# Patient Record
Sex: Female | Born: 1970
Health system: Southern US, Community
[De-identification: ages and names within clinical notes are randomized; demographics above are authoritative.]

## PROBLEM LIST (undated history)

## (undated) DIAGNOSIS — G4730 Sleep apnea, unspecified: Secondary | ICD-10-CM

## (undated) DIAGNOSIS — I10 Essential (primary) hypertension: Secondary | ICD-10-CM

## (undated) DIAGNOSIS — R0602 Shortness of breath: Secondary | ICD-10-CM

## (undated) DIAGNOSIS — IMO0002 Reserved for concepts with insufficient information to code with codable children: Secondary | ICD-10-CM

## (undated) DIAGNOSIS — M329 Systemic lupus erythematosus, unspecified: Secondary | ICD-10-CM

## (undated) HISTORY — DX: Reserved for concepts with insufficient information to code with codable children: IMO0002

## (undated) HISTORY — DX: Systemic lupus erythematosus, unspecified: M32.9

## (undated) HISTORY — DX: Essential (primary) hypertension: I10

---

## 1999-10-26 ENCOUNTER — Other Ambulatory Visit: Admission: RE | Admit: 1999-10-26 | Discharge: 1999-10-26 | Payer: Self-pay | Admitting: Obstetrics and Gynecology

## 2001-03-20 ENCOUNTER — Other Ambulatory Visit: Admission: RE | Admit: 2001-03-20 | Discharge: 2001-03-20 | Payer: Self-pay | Admitting: Gynecology

## 2001-09-24 ENCOUNTER — Other Ambulatory Visit: Admission: RE | Admit: 2001-09-24 | Discharge: 2001-09-24 | Payer: Self-pay | Admitting: Family Medicine

## 2002-03-22 ENCOUNTER — Other Ambulatory Visit: Admission: RE | Admit: 2002-03-22 | Discharge: 2002-03-22 | Payer: Self-pay | Admitting: Gynecology

## 2002-04-09 ENCOUNTER — Encounter: Payer: Self-pay | Admitting: Gynecology

## 2002-04-09 ENCOUNTER — Ambulatory Visit (HOSPITAL_COMMUNITY): Admission: RE | Admit: 2002-04-09 | Discharge: 2002-04-09 | Payer: Self-pay | Admitting: Gynecology

## 2002-05-23 ENCOUNTER — Ambulatory Visit (HOSPITAL_COMMUNITY): Admission: RE | Admit: 2002-05-23 | Discharge: 2002-05-23 | Payer: Self-pay | Admitting: Internal Medicine

## 2002-07-18 HISTORY — PX: ABDOMINAL SURGERY: SHX537

## 2002-07-29 ENCOUNTER — Encounter (INDEPENDENT_AMBULATORY_CARE_PROVIDER_SITE_OTHER): Payer: Self-pay | Admitting: Specialist

## 2002-07-29 ENCOUNTER — Inpatient Hospital Stay (HOSPITAL_COMMUNITY): Admission: RE | Admit: 2002-07-29 | Discharge: 2002-08-01 | Payer: Self-pay | Admitting: Gynecology

## 2003-05-06 ENCOUNTER — Other Ambulatory Visit: Admission: RE | Admit: 2003-05-06 | Discharge: 2003-05-06 | Payer: Self-pay | Admitting: Gynecology

## 2003-05-07 ENCOUNTER — Encounter: Admission: RE | Admit: 2003-05-07 | Discharge: 2003-05-07 | Payer: Self-pay | Admitting: Gynecology

## 2006-05-08 ENCOUNTER — Other Ambulatory Visit: Admission: RE | Admit: 2006-05-08 | Discharge: 2006-05-08 | Payer: Self-pay | Admitting: Gynecology

## 2007-05-09 ENCOUNTER — Other Ambulatory Visit: Admission: RE | Admit: 2007-05-09 | Discharge: 2007-05-09 | Payer: Self-pay | Admitting: Gynecology

## 2007-05-14 ENCOUNTER — Ambulatory Visit: Payer: Self-pay | Admitting: Internal Medicine

## 2007-06-21 ENCOUNTER — Ambulatory Visit: Payer: Self-pay | Admitting: Internal Medicine

## 2007-06-21 ENCOUNTER — Encounter: Payer: Self-pay | Admitting: Internal Medicine

## 2007-06-25 ENCOUNTER — Encounter: Payer: Self-pay | Admitting: Internal Medicine

## 2007-07-12 DIAGNOSIS — D259 Leiomyoma of uterus, unspecified: Secondary | ICD-10-CM | POA: Insufficient documentation

## 2007-07-12 DIAGNOSIS — K5909 Other constipation: Secondary | ICD-10-CM | POA: Insufficient documentation

## 2007-07-13 ENCOUNTER — Ambulatory Visit: Payer: Self-pay | Admitting: Internal Medicine

## 2007-07-13 DIAGNOSIS — K5120 Ulcerative (chronic) proctitis without complications: Secondary | ICD-10-CM | POA: Insufficient documentation

## 2007-07-13 DIAGNOSIS — R110 Nausea: Secondary | ICD-10-CM | POA: Insufficient documentation

## 2007-09-20 ENCOUNTER — Ambulatory Visit: Payer: Self-pay | Admitting: Internal Medicine

## 2008-01-25 ENCOUNTER — Ambulatory Visit (HOSPITAL_COMMUNITY): Admission: RE | Admit: 2008-01-25 | Discharge: 2008-01-25 | Payer: Self-pay | Admitting: Obstetrics and Gynecology

## 2008-02-05 ENCOUNTER — Encounter: Admission: RE | Admit: 2008-02-05 | Discharge: 2008-02-05 | Payer: Self-pay | Admitting: Family Medicine

## 2008-03-04 ENCOUNTER — Encounter (INDEPENDENT_AMBULATORY_CARE_PROVIDER_SITE_OTHER): Payer: Self-pay | Admitting: *Deleted

## 2008-05-15 ENCOUNTER — Other Ambulatory Visit: Admission: RE | Admit: 2008-05-15 | Discharge: 2008-05-15 | Payer: Self-pay | Admitting: Gynecology

## 2008-05-15 ENCOUNTER — Ambulatory Visit: Payer: Self-pay | Admitting: Gynecology

## 2008-05-15 ENCOUNTER — Encounter: Payer: Self-pay | Admitting: Gynecology

## 2008-05-28 ENCOUNTER — Ambulatory Visit: Payer: Self-pay | Admitting: Gynecology

## 2008-06-20 ENCOUNTER — Ambulatory Visit: Payer: Self-pay | Admitting: Gynecology

## 2008-07-03 ENCOUNTER — Ambulatory Visit: Payer: Self-pay | Admitting: Gynecology

## 2008-07-04 ENCOUNTER — Ambulatory Visit: Payer: Self-pay | Admitting: Gynecology

## 2008-11-06 ENCOUNTER — Encounter (INDEPENDENT_AMBULATORY_CARE_PROVIDER_SITE_OTHER): Payer: Self-pay | Admitting: Family Medicine

## 2008-11-06 ENCOUNTER — Ambulatory Visit (HOSPITAL_COMMUNITY): Admission: RE | Admit: 2008-11-06 | Discharge: 2008-11-06 | Payer: Self-pay | Admitting: Family Medicine

## 2008-11-06 ENCOUNTER — Ambulatory Visit: Payer: Self-pay | Admitting: Vascular Surgery

## 2008-11-13 ENCOUNTER — Ambulatory Visit: Payer: Self-pay | Admitting: Gynecology

## 2008-12-02 ENCOUNTER — Ambulatory Visit: Payer: Self-pay | Admitting: Gynecology

## 2008-12-15 ENCOUNTER — Encounter (INDEPENDENT_AMBULATORY_CARE_PROVIDER_SITE_OTHER): Payer: Self-pay | Admitting: *Deleted

## 2008-12-15 DIAGNOSIS — L52 Erythema nodosum: Secondary | ICD-10-CM | POA: Insufficient documentation

## 2008-12-15 DIAGNOSIS — M2550 Pain in unspecified joint: Secondary | ICD-10-CM | POA: Insufficient documentation

## 2008-12-29 ENCOUNTER — Ambulatory Visit: Payer: Self-pay | Admitting: Gynecology

## 2008-12-30 ENCOUNTER — Ambulatory Visit: Payer: Self-pay | Admitting: Infectious Diseases

## 2009-01-20 ENCOUNTER — Ambulatory Visit: Payer: Self-pay | Admitting: Gynecology

## 2009-01-27 ENCOUNTER — Telehealth: Payer: Self-pay | Admitting: Infectious Diseases

## 2009-05-06 ENCOUNTER — Telehealth: Payer: Self-pay | Admitting: Internal Medicine

## 2009-06-01 ENCOUNTER — Ambulatory Visit: Payer: Self-pay | Admitting: Gynecology

## 2009-06-01 ENCOUNTER — Other Ambulatory Visit: Admission: RE | Admit: 2009-06-01 | Discharge: 2009-06-01 | Payer: Self-pay | Admitting: Gynecology

## 2009-07-10 ENCOUNTER — Ambulatory Visit: Payer: Self-pay | Admitting: Gynecology

## 2010-02-16 NOTE — Progress Notes (Signed)
Summary: need to confirm prior positive ppd  Phone Note Other Incoming   Summary of Call: received note from HD stating she does not meet criteria for LTBI treatment due to ppd of only 2 mm. She has a reported history of positive ppds in past and a history of TB contact as a child.  SHe is currently on prednisone which can make the ppd negative.  WOuld consider quantiferon gold test but that can also be neg when on prednisone.  Can you call Dr Paulino Rily office (her PCP) and see if they ahve a documented positie PPD on her going back beforeshe was on prednisone.  Also Dr Nickola Major office may have one.  If not we need to discuss with her when her PPD was positive and try to find documentaiont of it so we can get the HD to treat her. Thanks Theodoro Grist    Initial call taken by: Clydie Braun MD,  January 27, 2009 11:02 AM  Follow-up for Phone Call        [facility] They do not have any TB skin test on file.Pt  has switched to  [facility] [place], please call this office [phone] the needed info. Tomasita Morrow RN  February 11, 2009 3:07 PM  RN spoke w/ Dr. Nickola Major clinic staff.  Requested PPD results and medications that the pt. was on at the time that the PPDs were placed.  [facility] [place] to fax these records Jennet Maduro RN  February 13, 2009 9:57 AM  Follow-up by: Tomasita Morrow RN,  February 11, 2009 3:07 PM

## 2010-02-16 NOTE — Progress Notes (Signed)
Summary: Schedule Office visit.   Phone Note Outgoing Call Call back at [facility] Phone [phone]   Call placed by: Harlow Mares CMA Duncan Dull),  May 06, 2009 3:06 PM Call placed to: Patient Summary of Call: Left message on patients machine to call back. patient is due for a follow up office visit.  Initial call taken by: Harlow Mares CMA Duncan Dull),  May 06, 2009 3:06 PM  Follow-up for Phone Call        Left message on patients machine to call back.  Follow-up by: Harlow Mares CMA Duncan Dull),  May 18, 2009 10:43 AM

## 2010-06-04 NOTE — H&P (Signed)
NAMEMIKAYA, BUNNER                           ACCOUNT NO.:  [id number]   MEDICAL RECORD NO.:  [id number]                   PATIENT TYPE:  INP   LOCATION:  NA                                   [facility]   PHYSICIAN:  Timothy P. Fontaine, M.D.           DATE OF BIRTH:  Dec 24, 1970   DATE OF ADMISSION:  07/29/2002  DATE OF DISCHARGE:                                HISTORY & PHYSICAL   CHIEF COMPLAINT:  Enlarging leiomyomata.   HISTORY OF PRESENT ILLNESS:  The patient is a 40 year old G0 with a history  of leiomyomata.  Over the past year her leiomyomata have continued to  increase in size and now are the size of an 18-week pregnancy and she is  admitted at this time for myomectomy.   PAST MEDICAL HISTORY:  None.   PAST SURGICAL HISTORY:  None.   CURRENT MEDICATIONS:  Oral contraceptives, vitamins, and iron.   FAMILY HISTORY:  Noncontributory.   SOCIAL HISTORY:  Noncontributory.   REVIEW OF SYSTEMS:  Noncontributory.   ADMISSION PHYSICAL EXAMINATION:  VITALS:  Afebrile; vital signs are stable.  HEENT:  Normal.  LUNGS:  Clear.  CARDIAC:  Regular rate; no rubs, murmurs, or gallops.  ABDOMEN:  Palpable uterus approximately 18 weeks' size; no other masses,  guarding, rebound, or organomegaly.  BIMANUAL PELVIC:  Uterus again 18 weeks' size, bulky, fills the pelvis.   ASSESSMENT AND PLAN:  The patient is a 40 year old G0 enlarging uterus -  ultrasound consistent with leiomyomata.  Ovaries are difficult to visualize  due to the bulk of the uterus.  The patient did have an HSG which showed a  normal-appearing cavity with bilateral fill and spill .  The patient is  interested in maintaining childbirth potential although she is not  immediately seeking pregnancy.  The options for continued expectant  management versus myomectomy now were discussed and the issues and given the  continued growth throughout the past year it was decided to proceed with  surgery now at 18 weeks'  size rather than awaiting it to enlarge further.  I  have discussed the issues of myomectomy with her to include the potential  negative impact as far as fertility in the future is concerned.  The patient  clearly understands the possibility of hysterectomy during the myomectomy if  significant bleeding or other complications arises and she gives me  permission to proceed with hysterectomy if I feel this is necessary.  She  also understands that if certain fibroids are in positions that I am afraid  that if we proceed with attempted removal that we realistically could  jeopardize hysterectomy or fertility that we will leave this leiomyomata and  that we may or may not need to address these in the future.  The patient  also understands that there is scarring due to the myomectomy both tubal as  well as intrauterine and that this may lead to  infertility following the  procedure.  She understands there are no guarantees I am going to remove all  the fibroids, that smaller ones that we are unable to palpate or difficult  to palpate that these may remain and regrow in the future and that she  certainly is at significant risk of forming new leiomyomata in the future  and that she may again develop regrowth of leiomyomata in the future.  The  patient has given 2 units of autologous blood, she understands that bleeding  is a significant risk of this procedure and that we may need to exceed  autologous blood and use heterologous blood and the risks of transfusion  both autologous and heterologous were discussed and include transfusion  reaction, hepatitis, HIV, mad cow disease and other unknown entities.  The  risk of infection both internal requiring prolonged antibiotics and abscess  formation requiring reoperation as well as wound complications requiring  opening and draining of incisions and closure by secondary intention was all  discussed, understood, and accepted.  The risk of injury to  internal organs  including bowel, bladder, ureters, vessels and nerves necessitating major  exploratory reparative surgery and future reparative surgeries including  ostomy formation was all discussed, understood, and accepted.  The patient's  questions were answered to her satisfaction and she is ready to proceed with  surgery.  She will continue on her iron twice a day as well as her oral  contraceptives through surgery.                                               Timothy P. Audie Box, M.D.    TPF/MEDQ  D:  07/28/2002  T:  07/28/2002  Job:  [id number]

## 2010-06-04 NOTE — Assessment & Plan Note (Signed)
[facility]                         GASTROENTEROLOGY OFFICE NOTE   KALLE, BERNATH                        MRN:          [id number]  DATE:05/14/2007                            DOB:          1970-08-24    Patient is self-referred.   REASON FOR CONSULTATION:  Rectal bleeding, nausea.   HISTORY:  This is a 40 year old female with a history of hypertension,  who presents today regarding rectal bleeding and nausea.  Patient  reports intermittent rectal bleeding over the past two months.  She  notices blood mixed with stool as well as on the tissue.  Her problem  has worsened somewhat in time.  There has been no associated abdominal  pain or change in bowel habits.  She is chronically constipated.  There  is no rectal pain with bleeding.  Her weight has fluctuated.  She does  have some heart burn and indigestion.  She has noted some nausea in the  morning over the past two weeks.  Last menstrual period was three weeks  ago.  Patient did undergo colonoscopy in May, 2004 because of a  questionable history of colitis and complaints of rectal bleeding.  The  examination at that time was normal.   PAST MEDICAL HISTORY:  Hypertension.   PAST SURGICAL HISTORY:  Myomectomy for fibroids.   ALLERGIES:  No known drug allergies.   CURRENT MEDICATIONS:  1. Micardis 20/6.25 mg daily.  2. Multivitamin.   FAMILY HISTORY:  No family history of gastrointestinal malignancy.   SOCIAL HISTORY:  Patient is single without children.  She lives alone.  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  She does not  smoke.  Occasionally uses alcohol.   REVIEW OF SYSTEMS:  Per diagnostic evaluation form.   PHYSICAL EXAMINATION:  A well-appearing female in no acute distress.  Blood pressure is 142/92, heart rate 72 and regular, respirations 14 and  unlabored.  Weight is 255.4 pounds.  She is 5 feet 6 inches in height.  HEENT:  Sclerae are anicteric.  Conjunctivae  are pink.  Oral mucosa is  intact.  There is no adenopathy.  Thyroid is normal.  LUNGS:  Clear.  HEART:  Regular without murmur.  ABDOMEN:  Obese and soft without tenderness, mass, or obvious hernia.  Good bowel sounds are heard.  RECTAL:  Deferred.  (Negative per gynecologist and family practitioner).  EXTREMITIES:  Without edema.   IMPRESSION:  23. A 40 year old female with a two month history of intermittent      rectal bleeding of uncertain cause.  Negative recent rectal      examinations.  Rule out neoplasia.  Rule out internal hemorrhoids.  2. Nonspecific nausea:  This is on the background of mild heartburn      and indigestion.  Possibly reflux-related.  3. Chronic constipation:  Stable.   RECOMMENDATIONS:  1. Empiric trial of Protonix 40 mg daily to see if this helps with      indigestion and nausea.  2. MiraLax as needed for chronic constipation.  3. Schedule colonoscopy to evaluate rectal bleeding.  The nature of  the procedure as well as the risks, benefits and alternatives have      been reviewed.  She understood and agreed to proceed.  4. Ongoing general medical care with Dr. Paulino Rily.     Wilhemina Bonito. Marina Goodell, MD  Electronically Signed    JNP/MedQ  DD: 05/14/2007  DT: 05/14/2007  Job #: [id number]   cc:   Emeterio Reeve, MD

## 2010-06-04 NOTE — Discharge Summary (Signed)
   Kristine Hayes, Kristine Hayes                           ACCOUNT NO.:  [id number]   MEDICAL RECORD NO.:  [id number]                   PATIENT TYPE:  INP   LOCATION:  9137                                 [facility]   PHYSICIAN:  Timothy P. Fontaine, M.D.           DATE OF BIRTH:  12-Feb-1970   DATE OF ADMISSION:  07/29/2002  DATE OF DISCHARGE:  08/01/2002                                 DISCHARGE SUMMARY   DISCHARGE DIAGNOSIS:  Symptomatic fibroid uterus increasing in size.   PROCEDURE:  Myomectomy.   HISTORY OF PRESENT ILLNESS:  A 40 year old gravida 0 with enlarging uterus.  Ultrasound consistent with leiomyomata.  Ovaries were difficult to visualize  with ultrasound due to bulk of the uterus.  She did have a normal HSG prior  to surgery.  The patient is interested in maintaining childbirth potential  although is not immediately seeking pregnancy.  Her uterus is now at about  an 18-week size that has enlarged over the past year.  The patient was given  2 units of autologous blood during her surgery.   HOSPITAL COURSE:  The patient was admitted on July 29, 2002 for a  myomectomy.  Myomectomy was performed by Colin Broach, assisted by Dr.  Eda Paschal.  Fibroids were removed.  Postpartum she was voiding without  problems, did well, although did have a fever of unknown origin.  Tmax was  101.5.  She was discharged home on postoperative day #3, voiding, and doing  well, although she did have a fever.  Lungs were clear.  No UTI signs or  symptoms.  Postoperative CBC:  White count 14, hemoglobin 8.5, hematocrit  25.5, and platelets were 202.   DISPOSITION:  1. The patient was discharged home and was to follow up in the office.  2. Prescription for Keflex 500 q.i.d. - she has had it for three days and     will continue a seven-day course.  3. Prescription for Tylox one to two q.4-6h. p.r.n. was given.  4. Will return to the office in two weeks for a postoperative check, as well     as  call if fever persists.  5. Staples were removed, Steri-Strips applied.     Davonna Belling. Young, N.P.                      Timothy P. Audie Box, M.D.    [name]  D:  08/21/2002  T:  08/21/2002  Job:  [id number]

## 2010-06-04 NOTE — Op Note (Signed)
Kristine Hayes, Kristine Hayes                           ACCOUNT NO.:  [id number]   MEDICAL RECORD NO.:  [id number]                   PATIENT TYPE:  INP   LOCATION:  9137                                 [facility]   PHYSICIAN:  Timothy P. Fontaine, M.D.           DATE OF BIRTH:  12/16/1970   DATE OF PROCEDURE:  07/29/2002  DATE OF DISCHARGE:                                 OPERATIVE REPORT   PREOPERATIVE DIAGNOSIS:  Leiomyomata.   POSTOPERATIVE DIAGNOSIS:  Leiomyomata.   PROCEDURE:  Multiple myomectomy.   SURGEON:  Timothy P. Fontaine, M.D.   ASSISTANT:  Rande Brunt. Eda Paschal, M.D.   ANESTHESIA:  General endotracheal.   ESTIMATED BLOOD LOSS:  500 mL.   COMPLICATIONS:  None.   SPECIMENS:  21 myomas, approximately 670 g intrauterine measurement.   FINDINGS:  Anterior cul-de-sac normal.  Posterior cul-de-sac normal.  Uterus  grossly enlarged above the level of the umbilicus with multiple subserosal  intramural myomas ranging in size from 6-7 cm to  5 mm.  No palpable myomas  left at the end of the procedure.  Right and left fallopian tubes normal  length, caliber, fimbriated ends.  Right and left ovaries grossly normal,  free, and mobile.   PROCEDURE:  The patient is taken to the operating room, properly identified,  placed in supine position, and underwent general endotracheal anesthesia  without difficulty.  The patient received some abdominal preparation with  Betadine scrub and solution, a perineal and vaginal preparation with  Betadine solutions, the Foley catheter was placed in sterile technique, and  the patient was draped in the usual fashion.  The abdomen was sharply  entered through a Pfannenstiel incision, ensuring adequate hemostasis at all  levels.  The uterus was delivered through the incision and through multiple  incisions to include the posterior, fundal, and anterior uterine surfaces,  all visual and palpated myomas were excised.  All incision sites were  pretreated using Vasopressin in a 10 unit per 100 mL dilution.  Care was  taken to deliver as many myomas as possible through each incision but given  the multiple nature and multiple locations, several incisions were necessary  over the uterine surface.  The endometrial cavity was not entered during the  procedure and there was no evidence of tubal disruption at the level of the  intramural portion, as all incisions and dissections were carried out away  from these areas; but given the depth and multiple incisions, a subsequent  cesarean section will be recommended to the patient for delivery.  All myoma  sites were closed, initially using 0 Vicryl suture in figure-of-eight  interrupted suture to reapproximate the myometrium and subsequently 2-0 PDS  in a running burying-type suture for the serosal surface.  The uterus was  observed afterwards to assure adequate hemostasis and subsequently the  uterus was returned to the abdomen.  Subsequent irrigation of the  subcutaneous abdominal area was  given with copious amounts of normal saline,  and adequate hemostasis was visualized subsequently.  The anterior fascia  was reapproximated using 0 Vicryl suture in a running stitch starting at the  angle  and meeting in the middle.  Subsequent subcutaneous tissue irrigation then  demonstrated adequate hemostasis and the skin was reapproximated with  staples, sterile dressing applied.  The patient awakened without difficulty  and taken to the recovery room in good condition, having tolerated the  procedure well.                                                Timothy P. Audie Box, M.D.    TPF/MEDQ  D:  07/29/2002  T:  07/30/2002  Job:  [id number]

## 2010-06-10 ENCOUNTER — Encounter (INDEPENDENT_AMBULATORY_CARE_PROVIDER_SITE_OTHER): Payer: 59 | Admitting: Gynecology

## 2010-06-10 ENCOUNTER — Other Ambulatory Visit (HOSPITAL_COMMUNITY)
Admission: RE | Admit: 2010-06-10 | Discharge: 2010-06-10 | Disposition: A | Discharge status: Home / Self Care | Payer: 59 | Source: Ambulatory Visit | Attending: Gynecology | Admitting: Gynecology

## 2010-06-10 ENCOUNTER — Other Ambulatory Visit: Payer: Self-pay | Admitting: Gynecology

## 2010-06-10 DIAGNOSIS — Z833 Family history of diabetes mellitus: Secondary | ICD-10-CM

## 2010-06-10 DIAGNOSIS — Z124 Encounter for screening for malignant neoplasm of cervix: Secondary | ICD-10-CM | POA: Insufficient documentation

## 2010-06-10 DIAGNOSIS — Z01419 Encounter for gynecological examination (general) (routine) without abnormal findings: Secondary | ICD-10-CM

## 2010-06-10 DIAGNOSIS — Z13220 Encounter for screening for lipoid disorders: Secondary | ICD-10-CM

## 2010-06-11 ENCOUNTER — Encounter: Payer: Self-pay | Admitting: Gynecology

## 2010-06-22 ENCOUNTER — Other Ambulatory Visit: Payer: 59

## 2010-06-22 ENCOUNTER — Ambulatory Visit (INDEPENDENT_AMBULATORY_CARE_PROVIDER_SITE_OTHER): Payer: 59 | Admitting: Gynecology

## 2010-06-22 DIAGNOSIS — D259 Leiomyoma of uterus, unspecified: Secondary | ICD-10-CM

## 2010-06-22 DIAGNOSIS — N852 Hypertrophy of uterus: Secondary | ICD-10-CM

## 2010-06-22 DIAGNOSIS — D252 Subserosal leiomyoma of uterus: Secondary | ICD-10-CM

## 2010-08-12 ENCOUNTER — Other Ambulatory Visit: Payer: Self-pay | Admitting: Rheumatology

## 2010-08-12 DIAGNOSIS — Z1231 Encounter for screening mammogram for malignant neoplasm of breast: Secondary | ICD-10-CM

## 2010-08-24 ENCOUNTER — Ambulatory Visit
Admission: RE | Admit: 2010-08-24 | Discharge: 2010-08-24 | Disposition: A | Discharge status: Home / Self Care | Payer: 59 | Source: Ambulatory Visit | Attending: Rheumatology | Admitting: Rheumatology

## 2010-08-24 DIAGNOSIS — Z1231 Encounter for screening mammogram for malignant neoplasm of breast: Secondary | ICD-10-CM

## 2011-09-09 ENCOUNTER — Other Ambulatory Visit: Payer: Self-pay | Admitting: Family Medicine

## 2011-09-09 DIAGNOSIS — Z1231 Encounter for screening mammogram for malignant neoplasm of breast: Secondary | ICD-10-CM

## 2011-09-27 ENCOUNTER — Ambulatory Visit
Admission: RE | Admit: 2011-09-27 | Discharge: 2011-09-27 | Disposition: A | Discharge status: Home / Self Care | Payer: 59 | Source: Ambulatory Visit | Attending: Family Medicine | Admitting: Family Medicine

## 2011-09-27 DIAGNOSIS — Z1231 Encounter for screening mammogram for malignant neoplasm of breast: Secondary | ICD-10-CM

## 2011-10-06 ENCOUNTER — Encounter (HOSPITAL_COMMUNITY)
Admission: RE | Admit: 2011-10-06 | Discharge: 2011-10-06 | Disposition: A | Discharge status: Home / Self Care | Payer: 59 | Source: Ambulatory Visit | Attending: Family Medicine | Admitting: Family Medicine

## 2011-10-06 DIAGNOSIS — D649 Anemia, unspecified: Secondary | ICD-10-CM | POA: Insufficient documentation

## 2011-10-06 LAB — PREPARE RBC (CROSSMATCH)

## 2011-10-06 MED ORDER — FUROSEMIDE 10 MG/ML IJ SOLN
20.0000 mg | Freq: Once | INTRAMUSCULAR | Status: DC
  Filled 2011-10-06 (×3): qty 2

## 2011-10-06 MED ORDER — SODIUM CHLORIDE 0.9 % IV SOLN: INTRAVENOUS | Status: DC

## 2011-10-06 MED ORDER — DIPHENHYDRAMINE HCL 50 MG/ML IJ SOLN
25.0000 mg | Freq: Once | INTRAMUSCULAR | Status: AC
  Administered 2011-10-06: 25 mg via INTRAVENOUS
  Filled 2011-10-06: qty 1

## 2011-10-07 LAB — TYPE AND SCREEN
ABO/RH(D): A POS
DAT, IgG: POSITIVE
Unit division: 0
Unit division: 0

## 2011-11-04 ENCOUNTER — Encounter: Payer: Self-pay | Admitting: Gynecology

## 2011-11-04 ENCOUNTER — Ambulatory Visit (INDEPENDENT_AMBULATORY_CARE_PROVIDER_SITE_OTHER): Payer: 59 | Admitting: Gynecology

## 2011-11-04 VITALS — BP 130/76 | Ht 65.5 in | Wt 268.0 lb

## 2011-11-04 DIAGNOSIS — N920 Excessive and frequent menstruation with regular cycle: Secondary | ICD-10-CM

## 2011-11-04 DIAGNOSIS — Z01419 Encounter for gynecological examination (general) (routine) without abnormal findings: Secondary | ICD-10-CM

## 2011-11-04 DIAGNOSIS — D259 Leiomyoma of uterus, unspecified: Secondary | ICD-10-CM

## 2011-11-04 LAB — CBC WITH DIFFERENTIAL/PLATELET
Basophils Absolute: 0.1 10*3/uL (ref 0.0–0.1)
Basophils Relative: 1 % (ref 0–1)
Hemoglobin: 9.1 g/dL — ABNORMAL LOW (ref 12.0–15.0)
Lymphocytes Relative: 33 % (ref 12–46)
MCHC: 29.4 g/dL — ABNORMAL LOW (ref 30.0–36.0)
Monocytes Relative: 12 % (ref 3–12)
Neutro Abs: 4.6 10*3/uL (ref 1.7–7.7)
Neutrophils Relative %: 50 % (ref 43–77)
WBC: 9.2 10*3/uL (ref 4.0–10.5)

## 2011-11-04 NOTE — Progress Notes (Signed)
Kristine Hayes [birth year] [id number]        41 y.o.  G2P0020 for annual exam.  Several issues noted below.  Past medical history,surgical history, medications, allergies, family history and social history were all reviewed and documented in the EPIC chart. ROS:  Was performed and pertinent positives and negatives are included in the history.  Exam: [name] assistant Filed Vitals:   11/04/11 1517  BP: 130/76  Height: 5' 5.5" (1.664 m)  Weight: 268 lb (121.564 kg)   General appearance  Normal Skin grossly normal Head/Neck normal with no cervical or supraclavicular adenopathy thyroid normal Lungs  clear Cardiac RR, without RMG Abdominal  soft, nontender, without organomegaly or hernia. Midline pelvic mass to umbilicus consistent with history of leiomyoma Breasts  examined lying and sitting without masses, retractions, discharge or axillary adenopathy. Pelvic  Ext/BUS/vagina  normal   Cervix  normal   Uterus  18 week size irregular consistent with leiomyoma  Adnexa unable to evaluate due to pelvic mass    Anus and perineum  normal   Rectovaginal  normal sphincter tone without palpated masses or tenderness.    Assessment/Plan:  41 y.o. G81P0020 female for annual exam.   1. Leiomyoma. Patient recently transfused by her primary physician due to anemia. Her menorrhagia continues. Myomas 18 weeks size. Reviewed options again to include conservative such as myomectomy/uterine artery embolization versus hysterectomy.. Patient wants to proceed with hysterectomy.  Reviewed ovarian conservation issue my recommendation to keep both ovaries as she is no strong family history of ovarian cancer and the benefits versus the risks reviewed.  I did recommend Depo-Lupron suppression at least for 3 months to allow for hemoglobin recovery period patient thinking of having the surgery in the spring and I would then suggest 6 month Lupron to avoid future transfusions. Patient agrees with this we'll make arrangements  to move towards this.  Side effect profile and risks of Depo-Lupron reviewed to include accelerated bone loss and symptoms such as hot flushes/sweats.  She will continue on daily iron supplementation. 2. Pap smear.  No Pap smear done today. Last Pap smear May 2012. No history of significant abnormal Pap smears. We'll plan less frequent screening every 3-5 years. 3. Mammography. Patient had in September.  Will continue with annual mammography. SBE monthly reviewed. 4. Health maintenance. Will check baseline CBC. No other blood work that is done through her primary physician's office. Follow up for Lupron and all ultimately for hysterectomy.    Dara Lords MD, 3:53 PM 11/04/2011

## 2011-11-04 NOTE — Patient Instructions (Addendum)
Office will contact you to arrange for Depo-Lupron shots and hysterectomy.  Uterine Fibroid A uterine fibroid is a growth (tumor) that occurs in a woman's uterus. This type of tumor is not cancerous and does not spread out of the uterus. A woman can have one or many fibroids, and the fiboid(s) can become quite large. A fibroid can vary in size, weight, and where it grows in the uterus. Most fibroids do not require medical treatment, but some can cause pain or heavy bleeding during and between periods. CAUSES  A fibroid is the result of a single uterine cell that keeps growing (unregulated), which is different than most cells in the human body. Most cells have a control mechanism that keeps them from reproducing without control.  SYMPTOMS   Bleeding.  Pelvic pain and pressure.  Bladder problems due to the size of the fibroid.  Infertility and miscarriages depending on the size and location of the fibroid. DIAGNOSIS  A diagnosis is made by physical exam. Your caregiver may feel the lumpy tumors during a pelvic exam. Important information regarding size, location, and number of tumors can be gained by having an ultrasound. It is rare that other tests, such as a CT scan or MRI, are needed. TREATMENT   Your caregiver may recommend watchful waiting. This involves getting the fibroid checked by your caregiver to see if the fibroids grow or shrink.   Hormonal treatment or an intrauterine device (IUD) may be prescribed.   Surgery may be needed to remove the fibroids (myomectomy) or the uterus (hysterectomy). This depends on your situation. When fibroids interfere with fertility and a woman wants to become pregnant, a caregiver may recommend having the fibroids removed.  HOME CARE INSTRUCTIONS  Home care depends on how you were treated. In general:   Keep all follow-up appointments with your caregiver.   Only take medicine as told by your caregiver. Do not take aspirin. It can cause bleeding.    If you have excessive periods and soak tampons or pads in a half hour or less, contact your caregiver immediately. If your periods are troublesome but not so heavy, lie down with your feet raised slightly above your heart. Place cold packs on your lower abdomen.   If your periods are heavy, write down the number of pads or tampons you use per month. Bring this information to your caregiver.   Talk to your caregiver about taking iron pills.   Include green vegetables in your diet.   If you were prescribed a hormonal treatment, take the hormonal medicines as directed.   If you need surgery, ask your caregiver for information on your specific surgery.  SEEK IMMEDIATE MEDICAL CARE IF:  You have pelvic pain or cramps not controlled with medicines.   You have a sudden increase in pelvic pain.   You have an increase of bleeding between and during periods.   You feel lightheaded or have fainting episodes.  MAKE SURE YOU:  Understand these instructions.  Will watch your condition.  Will get help right away if you are not doing well or get worse. Document Released: 01/01/2000 Document Revised: 03/28/2011 Document Reviewed: 01/24/2011 [facility] Patient Information 2013 [place], [place].

## 2011-11-07 ENCOUNTER — Encounter: Payer: Self-pay | Admitting: Gynecology

## 2011-11-23 ENCOUNTER — Telehealth: Payer: Self-pay | Admitting: Gynecology

## 2011-11-23 NOTE — Telephone Encounter (Signed)
I left message for patient to call me and let me know has she heard anything from [facility] pharmacy regarding her Lupron order.  The last correspondence I received from [facility] indicated that [facility] would be contacting patient to arrange shipment.

## 2011-11-24 NOTE — Telephone Encounter (Signed)
Patient called back. She said that [facility] had called her and that she has just been busy and has not called them back.  She said that it is up to her at this point and she will let me know when she has taken care of it.

## 2011-11-28 ENCOUNTER — Ambulatory Visit (INDEPENDENT_AMBULATORY_CARE_PROVIDER_SITE_OTHER): Payer: 59 | Admitting: Gynecology

## 2011-11-28 ENCOUNTER — Ambulatory Visit (INDEPENDENT_AMBULATORY_CARE_PROVIDER_SITE_OTHER): Payer: 59

## 2011-11-28 ENCOUNTER — Encounter: Payer: Self-pay | Admitting: Gynecology

## 2011-11-28 ENCOUNTER — Other Ambulatory Visit: Payer: Self-pay | Admitting: Gynecology

## 2011-11-28 VITALS — BP 128/76

## 2011-11-28 DIAGNOSIS — L909 Atrophic disorder of skin, unspecified: Secondary | ICD-10-CM

## 2011-11-28 DIAGNOSIS — L919 Hypertrophic disorder of the skin, unspecified: Secondary | ICD-10-CM

## 2011-11-28 DIAGNOSIS — D252 Subserosal leiomyoma of uterus: Secondary | ICD-10-CM

## 2011-11-28 DIAGNOSIS — D259 Leiomyoma of uterus, unspecified: Secondary | ICD-10-CM

## 2011-11-28 DIAGNOSIS — N920 Excessive and frequent menstruation with regular cycle: Secondary | ICD-10-CM

## 2011-11-28 DIAGNOSIS — N8300 Follicular cyst of ovary, unspecified side: Secondary | ICD-10-CM

## 2011-11-28 DIAGNOSIS — N852 Hypertrophy of uterus: Secondary | ICD-10-CM

## 2011-11-28 DIAGNOSIS — D251 Intramural leiomyoma of uterus: Secondary | ICD-10-CM

## 2011-11-28 NOTE — Progress Notes (Signed)
Patient presents for ultrasound which confirms multiple myomas, the largest measuring 94 mm. Right/left ovaries were visualized and normal. Plan is Depo-Lupron for 3-6 months to allow hemoglobin recovery and hopefully shrinkage of the uterus and then proceeding with a TAH. Patient is not sure whether she'll be ready in 3 months for surgery or whether she would rather wait 6 months. Need to be on extra iron daily stressed and recommended recheck of her CBC and we'll see where she stands whether she wants to proceed with surgery at that time or to wait another 3 months.  The the need to stay on iron daily was stressed.

## 2011-11-28 NOTE — Patient Instructions (Signed)
Start on Lupron as we discussed. Recheck blood count and reassess in 3 months. Ultimately will progress towards surgery.

## 2012-01-04 ENCOUNTER — Telehealth: Payer: Self-pay | Admitting: Gynecology

## 2012-01-04 NOTE — Telephone Encounter (Signed)
Patient called and left message that she has called [facility] pharmacy to start up the Lupron ordering process again. She said I should be hearing from them but might be after first of the year.

## 2012-01-13 ENCOUNTER — Telehealth: Payer: Self-pay | Admitting: *Deleted

## 2012-01-13 NOTE — Telephone Encounter (Signed)
[facility] called and needed to set up delivery for Depo Lupron. Delivery will be 01/17/12. Will call patient when it arrives in the office. KW

## 2012-01-17 ENCOUNTER — Telehealth: Payer: Self-pay | Admitting: Gynecology

## 2012-01-17 NOTE — Telephone Encounter (Signed)
I left a message for patient that her Lupron has arrived here at the office and she can come for injection.  I asked her to call me back with when she would like to come so I can schedule a nurse appt for her.  I also reminded her surgery will be 10-12 weeks from injection and that will be March and I do not have that schedule yet but will call her as soon as I do.

## 2012-01-20 ENCOUNTER — Ambulatory Visit (INDEPENDENT_AMBULATORY_CARE_PROVIDER_SITE_OTHER): Payer: 59 | Admitting: Gynecology

## 2012-01-20 DIAGNOSIS — D219 Benign neoplasm of connective and other soft tissue, unspecified: Secondary | ICD-10-CM

## 2012-01-20 MED ORDER — LEUPROLIDE ACETATE (3 MONTH) 11.25 MG IM KIT
11.2500 mg | PACK | Freq: Once | INTRAMUSCULAR | Status: AC
  Administered 2012-01-20: 11.25 mg via INTRAMUSCULAR

## 2012-03-01 ENCOUNTER — Ambulatory Visit: Payer: 59 | Admitting: Gynecology

## 2012-03-08 ENCOUNTER — Ambulatory Visit (INDEPENDENT_AMBULATORY_CARE_PROVIDER_SITE_OTHER): Payer: 59 | Admitting: Gynecology

## 2012-03-08 ENCOUNTER — Encounter: Payer: Self-pay | Admitting: Gynecology

## 2012-03-08 DIAGNOSIS — D259 Leiomyoma of uterus, unspecified: Secondary | ICD-10-CM

## 2012-03-08 DIAGNOSIS — N926 Irregular menstruation, unspecified: Secondary | ICD-10-CM

## 2012-03-08 LAB — CBC WITH DIFFERENTIAL/PLATELET
Lymphocytes Relative: 34 % (ref 12–46)
Lymphs Abs: 2.6 10*3/uL (ref 0.7–4.0)
MCV: 69.1 fL — ABNORMAL LOW (ref 78.0–100.0)
Neutrophils Relative %: 49 % (ref 43–77)
Platelets: 346 10*3/uL (ref 150–400)
RBC: 4.33 MIL/uL (ref 3.87–5.11)
WBC: 7.7 10*3/uL (ref 4.0–10.5)

## 2012-03-08 MED ORDER — MEGESTROL ACETATE 20 MG PO TABS: 20.0000 mg | ORAL_TABLET | Freq: Every day | ORAL | Status: DC

## 2012-03-08 NOTE — Progress Notes (Signed)
Patient presents with history of large fibroid uterus, menorrhagia and anemia who received Depo-Lupron 01/20/2012 in anticipation of surgery. Her plan was to wait to the end of the school year to receive 2 shots of Depo-Lupron hopefully have a good hemoglobin recovery period patient notes that she's been bleeding on and off for the last several weeks.  Exam with [name] assistant Abdomen with 18 week size uterus, soft nontender without rebound guarding organomegaly Pelvic external BUS vagina with light menses flow. Cervix normal. Bimanual with bulky pelvic mass consistent with large uterus.  Assessment and plan: Leiomyoma, 6-7 weeks and to Depo-Lupron with spotting and sometimes heavy bleeding coming and going. Patient planning hysterectomy late spring. We'll check baseline CBC today and suppress with Megace 20 mg twice a day initially and then daily for 3 weeks and then stop we'll see how she does. She'll follow up early April when due for her second Lupron shot. We'll plan surgery following this and schedule this at her convenience.

## 2012-03-08 NOTE — Patient Instructions (Signed)
Start on Megace medication twice daily for several days until bleeding decreased and then daily for 3 weeks and then stop we'll see how you do. Call if heavy irregular bleeding continues. Follow up when due for your next Lupron shot. Make sure we schedule your surgery on you're still within the 3 month window of your second Lupron shot.

## 2012-03-12 ENCOUNTER — Other Ambulatory Visit: Payer: 59

## 2012-03-12 ENCOUNTER — Other Ambulatory Visit: Payer: Self-pay | Admitting: Gynecology

## 2012-03-12 DIAGNOSIS — D649 Anemia, unspecified: Secondary | ICD-10-CM

## 2012-03-13 ENCOUNTER — Other Ambulatory Visit: Payer: Self-pay | Admitting: Gynecology

## 2012-03-13 DIAGNOSIS — D509 Iron deficiency anemia, unspecified: Secondary | ICD-10-CM

## 2012-03-13 LAB — IRON AND TIBC
%SAT: 4 % — ABNORMAL LOW (ref 20–55)
Iron: 21 ug/dL — ABNORMAL LOW (ref 42–145)
TIBC: 470 ug/dL (ref 250–470)

## 2012-03-22 NOTE — Progress Notes (Signed)
Pt due for lab at end of March - pt was informed KW

## 2012-04-10 ENCOUNTER — Other Ambulatory Visit: Payer: 59

## 2012-04-10 ENCOUNTER — Telehealth: Payer: Self-pay

## 2012-04-10 DIAGNOSIS — D509 Iron deficiency anemia, unspecified: Secondary | ICD-10-CM

## 2012-04-10 LAB — CBC WITH DIFFERENTIAL/PLATELET
Basophils Absolute: 0.1 10*3/uL (ref 0.0–0.1)
Basophils Relative: 1 % (ref 0–1)
MCHC: 30.4 g/dL (ref 30.0–36.0)
Neutro Abs: 4.3 10*3/uL (ref 1.7–7.7)
Neutrophils Relative %: 49 % (ref 43–77)
RDW: 20 % — ABNORMAL HIGH (ref 11.5–15.5)

## 2012-04-10 NOTE — Telephone Encounter (Signed)
[facility] pharmacy called to arrange shipment for patient's Lupron Depot 11.25 mg.  Reference #[id number] SD.  I spoke with rep and confirmed shipment for 04/13/12.

## 2012-04-11 ENCOUNTER — Telehealth: Payer: Self-pay | Admitting: Gynecology

## 2012-04-11 NOTE — Telephone Encounter (Signed)
Pt informed with the below note. 

## 2012-04-11 NOTE — Telephone Encounter (Signed)
Tell patient blood count starting to get better although still anemic. Make sure that she stays on iron twice daily if possible or at least once daily. Do not stop this before surgery

## 2012-04-17 ENCOUNTER — Telehealth: Payer: Self-pay

## 2012-04-17 NOTE — Telephone Encounter (Signed)
My understanding was that she received her first shot of Depo-Lupron 01/20/2012 as documented in chart review and that this is her second shot at a three-month interval.  If so then it's appropriate to go and get the shot.

## 2012-04-17 NOTE — Telephone Encounter (Signed)
Thank you for clarifying. I created my own confusion. Sorry I had to bother you with it!

## 2012-04-17 NOTE — Telephone Encounter (Signed)
Patient informed that her Depot Lupron 11.25 has arrived at the office. She scheduled injection appointment for Fri 04/20/12.   Dr. Velvet Bathe- The original order for Lupron back 11/04/11 visit was for Lupron 11.25 mg. but patient never followed through until now.However, your last note dated 03/08/12 confused me a bit because you mentioned her getting a second injection early April.  I just want to be sure you are okay with her getting the 11.25mg  on Friday?  Also, the Oct surgery slip note said that you would like her to get two injections but if she wants sooner that one injection okay.  Is that plan still okay?

## 2012-04-20 ENCOUNTER — Ambulatory Visit (INDEPENDENT_AMBULATORY_CARE_PROVIDER_SITE_OTHER): Payer: 59 | Admitting: Gynecology

## 2012-04-20 ENCOUNTER — Telehealth: Payer: Self-pay

## 2012-04-20 DIAGNOSIS — D259 Leiomyoma of uterus, unspecified: Secondary | ICD-10-CM

## 2012-04-20 MED ORDER — LEUPROLIDE ACETATE (3 MONTH) 11.25 MG IM KIT
11.2500 mg | PACK | Freq: Once | INTRAMUSCULAR | Status: AC
  Administered 2012-04-20: 11.25 mg via INTRAMUSCULAR

## 2012-04-20 NOTE — Telephone Encounter (Signed)
I called patient to discuss scheduling surgery. She received her Lupron 11.25mg  inj today so her 10-12 week window to schedule surgery is June 13-27.  Patient said she wants to consider these dates over the weekend and she will call me Monday with her choice.  I, also, told her I would check ins benefits and have her some financial info available on Monday when she calls me.

## 2012-04-24 ENCOUNTER — Telehealth: Payer: Self-pay

## 2012-04-24 NOTE — Telephone Encounter (Signed)
Patient called in voice mail. Said she will be in class the rest of today but will call me in the morning.

## 2012-04-24 NOTE — Telephone Encounter (Signed)
Patient called to say she has considered dates for surgery and would like to try to schedule for 07/09/12.  I returned her call with that date scheduled and left message for her to call me as we need to get her scheduled for pre-op consult.

## 2012-04-26 ENCOUNTER — Telehealth: Payer: Self-pay

## 2012-04-26 ENCOUNTER — Other Ambulatory Visit: Payer: Self-pay | Admitting: Gynecology

## 2012-04-26 DIAGNOSIS — D259 Leiomyoma of uterus, unspecified: Secondary | ICD-10-CM

## 2012-04-26 MED ORDER — MEGESTROL ACETATE 20 MG PO TABS: 20.0000 mg | ORAL_TABLET | Freq: Two times a day (BID) | ORAL | Status: DC

## 2012-04-26 NOTE — Telephone Encounter (Signed)
Patient informed. Rx e-scribed,  Patient wanted me to ask Dr. Velvet Bathe is she could have an ultrasound for "peace of mind"  when she has pre-op consult?

## 2012-04-26 NOTE — Telephone Encounter (Signed)
Patient said she had started period prior to Lupron inj on 04/20/12.  That bleeding is continuing althought it does seem like it might be lighter and trying to taper. She has been taking Megace and only has #8 left and wondered if you might want her to stay on the Megace a bit longer or to stop them and see what happens.

## 2012-04-26 NOTE — Telephone Encounter (Signed)
I would recommend Megace 20 mg tab twice daily until bleeding down to spotting. Then can stop

## 2012-04-26 NOTE — Telephone Encounter (Signed)
Okay to schedule

## 2012-04-26 NOTE — Telephone Encounter (Signed)
Kristine Hayes will call patient and schedule.

## 2012-05-21 ENCOUNTER — Other Ambulatory Visit: Payer: Self-pay | Admitting: Gynecology

## 2012-05-21 NOTE — Telephone Encounter (Signed)
TAH scheduled July 09, 2012.

## 2012-06-05 ENCOUNTER — Other Ambulatory Visit: Payer: Self-pay | Admitting: Gynecology

## 2012-06-21 ENCOUNTER — Other Ambulatory Visit: Payer: Self-pay | Admitting: Gynecology

## 2012-06-25 ENCOUNTER — Ambulatory Visit: Payer: 59 | Admitting: Gynecology

## 2012-06-27 ENCOUNTER — Ambulatory Visit (INDEPENDENT_AMBULATORY_CARE_PROVIDER_SITE_OTHER): Payer: 59 | Admitting: Gynecology

## 2012-06-27 ENCOUNTER — Other Ambulatory Visit: Payer: Self-pay | Admitting: Gynecology

## 2012-06-27 ENCOUNTER — Encounter: Payer: Self-pay | Admitting: Gynecology

## 2012-06-27 ENCOUNTER — Ambulatory Visit (INDEPENDENT_AMBULATORY_CARE_PROVIDER_SITE_OTHER): Payer: 59

## 2012-06-27 DIAGNOSIS — D251 Intramural leiomyoma of uterus: Secondary | ICD-10-CM

## 2012-06-27 DIAGNOSIS — N920 Excessive and frequent menstruation with regular cycle: Secondary | ICD-10-CM

## 2012-06-27 DIAGNOSIS — N852 Hypertrophy of uterus: Secondary | ICD-10-CM

## 2012-06-27 DIAGNOSIS — D259 Leiomyoma of uterus, unspecified: Secondary | ICD-10-CM

## 2012-06-27 DIAGNOSIS — D252 Subserosal leiomyoma of uterus: Secondary | ICD-10-CM

## 2012-06-27 DIAGNOSIS — L909 Atrophic disorder of skin, unspecified: Secondary | ICD-10-CM

## 2012-06-27 DIAGNOSIS — L919 Hypertrophic disorder of the skin, unspecified: Secondary | ICD-10-CM

## 2012-06-27 DIAGNOSIS — E65 Localized adiposity: Secondary | ICD-10-CM

## 2012-06-27 NOTE — H&P (Addendum)
Kristine Hayes 02-06-1970 [id number]   History and Physical  Chief complaint: Leiomyoma, pelvic pain, menorrhagia, anemia  History of present illness: 42 y.o. G2P0020 with long history of leiomyoma status post abdominal myomectomy 2004. Most recently has been having heavier menses leading to anemia ultimately requiring transfusion earlier this year. Exam and ultrasound consistent with multiple large myomas. Patient has been on Depo Lupron suppression for 6 months and iron replacement. Most recently was started on Megace to continually suppress her menses. Options for management to include myomectomy, uterine artery embolization hysterectomy were reviewed and the patient wants to proceed with hysterectomy and she's admitted for TAH due to the bulk of her uterus. Most recent ultrasound shows at least 5 leiomyoma measuring 5 cm and a 9 cm myoma   Past medical history,surgical history, medications, allergies, family history and social history were all reviewed and documented in the EPIC chart. ROS:  Was performed and pertinent positives and negatives are included in the history of present illness.  Exam:  [name] assistant General: well developed, well nourished female, no acute distress HEENT: normal  Lungs: clear to auscultation without wheezing, rales or rhonchi  Cardiac: regular rate without rubs, murmurs or gallops  Abdomen: soft, 18-20 week size uterus midline mobile filling the pelvis Pelvic: external bus vagina: normal   Cervix: grossly normal  Uterus: 18-20 week size  Adnexa: without masses or tenderness      Assessment/Plan:  42 y.o. G2P0020 with history outlined above. Options for more conservative management offered and the patient wants to proceed with definitive hysterectomy. Due to the size of the uterus will plan on TAH through a midline incision. I did discuss with her a possible high Pfannenstiel if after she is asleep intraoperative assessment would allow but at this point we're  tentatively planning on a TAH. The ovarian conservation issue was reviewed with her and the options to keep both ovaries were removed both ovaries discussed. The patient understands by keeping both ovaries she will keep continued estrogen production but also keeps the risk of ovarian disease up to and including ovarian cancer in the future. By removing her ovaries we would remove her estrogen production with the risk of symptoms, accelerated cardiovascular risk and osteoporosis risk. The issues of HRT if needed also discussed. The patient at this point wants to keep both ovaries. She accepts risks as outlined above. She does give me permission to remove one or both ovaries if at the time of surgery intraoperatively as my best decision to do so. The expected intraoperative postoperative course is in recovery period were reviewed. The risks of infection, prolonged antibiotics, abscess or hematoma formation requiring reoperation and drainage were reviewed. The risk of hemorrhage necessitating transfusion and the risks of transfusion to include transfusion reaction hepatitis HIV mad cow disease and other unknown entities were reviewed. Incisional complications were realistically reviewed given her panniculus and weight. The risks of infection, seroma or hematoma requiring opening and draining of incisions, closure by secondary intention which could take weeks to months as well as long-term issues such as cosmetics, keloid and hernia formation were reviewed. The risk of inadvertent injury to internal organs either immediately recognized or delay recognized including bowel bladder ureters vessels and nerves necessitating major exploratory reparative surgeries and future repair of surgeries including ostomy formation bowel resection and bladder repair ureteral damage repair was all discussed with her. Absolute irreversible sterility associated with hysterectomy was reviewed. Sexuality following hysterectomy to include  persistent orgasmic dysfunction and persistent dyspareunia was also  discussed. The patient's questions were answered to her satisfaction and she is ready to proceed with surgery.   Dara Lords MD, 5:25 PM 06/27/2012

## 2012-06-27 NOTE — Patient Instructions (Addendum)
Followup for surgery as scheduled. 

## 2012-06-27 NOTE — Progress Notes (Signed)
Kristine Hayes 29-Mar-1970 [id number]   Preoperative consult  Chief complaint: Leiomyoma, pelvic pain, menorrhagia, anemia  History of present illness: 42 y.o. G2P0020 with long history of leiomyoma status post abdominal myomectomy 2004. Most recently has been having heavier menses leading to anemia ultimately requiring transfusion earlier this year. Exam and ultrasound consistent with multiple large myomas. Patient has been on Depo Lupron suppression for 6 months and iron replacement. Most recently was started on Megace to continually suppress her menses. Options for management to include myomectomy, uterine artery embolization hysterectomy were reviewed and the patient must proceed with hysterectomy and she's admitted for TAH due to the bulk of her uterus. Most recent ultrasound shows at least 5 leiomyoma measuring 5 cm and a 9 cm myoma   Past medical history,surgical history, medications, allergies, family history and social history were all reviewed and documented in the EPIC chart. ROS:  Was performed and pertinent positives and negatives are included in the history of present illness.  Exam:  [name] assistant General: well developed, well nourished female, no acute distress HEENT: normal  Lungs: clear to auscultation without wheezing, rales or rhonchi  Cardiac: regular rate without rubs, murmurs or gallops  Abdomen: soft, 18-20 week size uterus midline mobile filling the pelvis Pelvic: external bus vagina: normal   Cervix: grossly normal  Uterus: 18-20 week size  Adnexa: without masses or tenderness      Assessment/Plan:  42 y.o. G2P0020 with history outlined above. Options for more conservative management offered the patient must proceed with definitive hysterectomy. Due to the size of the uterus will plan on TAH through a midline incision. I did discuss with her a possible high Pfannenstiel if after she is asleep intraoperative assessment would allow but at this point we're tentatively  planning on a TAH. The ovarian conservation issue was reviewed with her and the options to keep both ovaries were removed both ovaries discussed. The patient understands by keeping both ovaries she will keep continued estrogen production but also keeps the risk of ovarian disease up to and including ovarian cancer in the future. By removing her ovaries we would remove her estrogen production with the risk of symptoms, accelerated cardiovascular risk and osteoporosis risk. The issues of HRT if needed also discussed. The patient at this point wants to keep both ovaries. She accepts risks as outlined above. She does give me permission to remove one or both ovaries if at the time of surgery intraoperatively as my best decision to do so. The expected intraoperative postoperative course is in recovery period were reviewed. The risks of infection, prolonged antibiotics, abscess or hematoma formation requiring reoperation and drainage were reviewed. The risk of hemorrhage necessitating transfusion and the risks of transfusion to include transfusion reaction hepatitis HIV mad cow disease and other unknown entities were reviewed. Incisional complications were realistically reviewed given her panniculus and weight. The risks of infection, seroma or hematoma requiring opening and draining of incisions, closure by secondary intention which could take weeks to months as well as long-term issues such as cosmetics, keloid and hernia formation were reviewed. The risk of inadvertent injury to internal organs either immediately recognized or delay recognized including bowel bladder ureters vessels and nerves necessitating major exploratory reparative surgeries and future repair of surgeries including ostomy formation bowel resection and bladder repair ureteral damage repair was all discussed with her. Absolute irreversible sterility associated with hysterectomy was reviewed. Sexuality following hysterectomy to include persistent  orgasmic dysfunction and persistent dyspareunia was also discussed. The patient's questions  were answered to her satisfaction and she is ready to proceed with surgery.    Dara Lords MD, 5:14 PM 06/27/2012

## 2012-07-02 ENCOUNTER — Encounter (HOSPITAL_COMMUNITY): Payer: Self-pay

## 2012-07-02 ENCOUNTER — Other Ambulatory Visit: Payer: Self-pay

## 2012-07-02 ENCOUNTER — Encounter (HOSPITAL_COMMUNITY)
Admission: RE | Admit: 2012-07-02 | Discharge: 2012-07-02 | Disposition: A | Discharge status: Home / Self Care | Payer: 59 | Source: Ambulatory Visit | Attending: Gynecology | Admitting: Gynecology

## 2012-07-02 ENCOUNTER — Encounter (HOSPITAL_COMMUNITY): Payer: Self-pay | Admitting: Pharmacy Technician

## 2012-07-02 DIAGNOSIS — Z01812 Encounter for preprocedural laboratory examination: Secondary | ICD-10-CM | POA: Insufficient documentation

## 2012-07-02 DIAGNOSIS — Z01818 Encounter for other preprocedural examination: Secondary | ICD-10-CM | POA: Insufficient documentation

## 2012-07-02 HISTORY — DX: Sleep apnea, unspecified: G47.30

## 2012-07-02 HISTORY — DX: Shortness of breath: R06.02

## 2012-07-02 LAB — COMPREHENSIVE METABOLIC PANEL
Albumin: 4 g/dL (ref 3.5–5.2)
Alkaline Phosphatase: 57 U/L (ref 39–117)
BUN: 13 mg/dL (ref 6–23)
CO2: 23 mEq/L (ref 19–32)
Chloride: 107 mEq/L (ref 96–112)
Creatinine, Ser: 0.96 mg/dL (ref 0.50–1.10)
GFR calc Af Amer: 82 mL/min — ABNORMAL LOW (ref >90)
GFR calc non Af Amer: 71 mL/min — ABNORMAL LOW (ref >90)
Glucose, Bld: 96 mg/dL (ref 70–99)
Total Bilirubin: 0.6 mg/dL (ref 0.3–1.2)

## 2012-07-02 LAB — CBC
HCT: 40.5 % (ref 36.0–46.0)
Hemoglobin: 13.3 g/dL (ref 12.0–15.0)
MCV: 82 fL (ref 78.0–100.0)
RDW: 18.7 % — ABNORMAL HIGH (ref 11.5–15.5)
WBC: 9 10*3/uL (ref 4.0–10.5)

## 2012-07-02 LAB — SURGICAL PCR SCREEN
MRSA, PCR: INVALID — AB
Staphylococcus aureus: INVALID — AB

## 2012-07-02 NOTE — Pre-Procedure Instructions (Addendum)
Abnormal EKG shown to Dr. Malen Gauze and he wants her PMD to compare today's EKG to old EKG. I faxed new EKG to Dr. Paulino Rily' office and per Angelique Blonder in her office, EKG shows no change from EKG done in 2011. Dr. Paulino Rily stated ( per Angelique Blonder) that today's EKG is stable.

## 2012-07-02 NOTE — Patient Instructions (Addendum)
Your procedure is scheduled on:07/09/12  Enter through the Main Entrance at :0600 am Pick up desk phone and dial [phone] and inform us of your arrival.  Please call [phone] if you have any problems the morning of surgery.  Remember: Do not eat or drink after midnight:SUNDAY  You may brush your teeth the morning of surgery.  Take these meds the morning of surgery with a sip of water:Micardis  DO NOT wear jewelry, eye make-up, lipstick,body lotion, or dark fingernail polish.  (Polished toes are ok)   If you are to be admitted after surgery, leave suitcase in car until your room has been assigned. Patients discharged on the day of surgery will not be allowed to drive home. Wear loose fitting, comfortable clothes for your ride home.

## 2012-07-03 ENCOUNTER — Telehealth: Payer: Self-pay

## 2012-07-03 NOTE — Pre-Procedure Instructions (Signed)
Kristine Hayes ( Dr. Reynold Bowen scheduler) notified about EKG and follow up

## 2012-07-03 NOTE — Telephone Encounter (Signed)
Marylu Lund called to give Dr. Audie Box info. She said that patient had abnormal EKG.  Dr. Malen Gauze who was on call yesterday and will be the anesthesiologist for her surgery said she needed to be cleared by her PCP.  They contacted Dr. Paulino Rily, her PCP, and she compared current EKG to one from 3 years ago and it is unchanged.  Also, patient had cardiology work-up 2011 with [facility] and all was fine.  Dr. Malen Gauze is okay with proceeding with surgery but they wanted you to know about this.

## 2012-07-03 NOTE — Pre-Procedure Instructions (Signed)
Dr. Malen Gauze read notes from pt's cardiologist sent by pt's PMD, Dr. Paulino Rily. Per Angelique Blonder in Dr Paulino Rily' office "no changes in EKG since 2011"

## 2012-07-05 NOTE — Telephone Encounter (Signed)
Dr. Velvet Bathe- Just wanted to be sure you have seen this.

## 2012-07-05 NOTE — Telephone Encounter (Signed)
As long as he anesthesiologist is comfortable with this it sounds like these are old changes and I think proceeding with surgery is fine.

## 2012-07-08 MED ORDER — DEXTROSE 5 % IV SOLN
2.0000 g | INTRAVENOUS | Status: AC
  Administered 2012-07-09: 2 g via INTRAVENOUS
  Filled 2012-07-08: qty 2

## 2012-07-09 ENCOUNTER — Encounter (HOSPITAL_COMMUNITY)
Admission: RE | Disposition: A | Discharge status: Home / Self Care | Payer: Self-pay | Source: Ambulatory Visit | Attending: Gynecology

## 2012-07-09 ENCOUNTER — Encounter (HOSPITAL_COMMUNITY): Payer: Self-pay | Admitting: Anesthesiology

## 2012-07-09 ENCOUNTER — Encounter (HOSPITAL_COMMUNITY): Payer: Self-pay

## 2012-07-09 ENCOUNTER — Inpatient Hospital Stay (HOSPITAL_COMMUNITY): Payer: 59 | Admitting: Anesthesiology

## 2012-07-09 ENCOUNTER — Ambulatory Visit (HOSPITAL_COMMUNITY)
Admission: RE | Admit: 2012-07-09 | Discharge: 2012-07-11 | Disposition: A | Discharge status: Home / Self Care | Payer: 59 | Source: Ambulatory Visit | Attending: Gynecology | Admitting: Gynecology

## 2012-07-09 DIAGNOSIS — N920 Excessive and frequent menstruation with regular cycle: Secondary | ICD-10-CM | POA: Insufficient documentation

## 2012-07-09 DIAGNOSIS — D251 Intramural leiomyoma of uterus: Secondary | ICD-10-CM | POA: Insufficient documentation

## 2012-07-09 DIAGNOSIS — D649 Anemia, unspecified: Secondary | ICD-10-CM

## 2012-07-09 DIAGNOSIS — D259 Leiomyoma of uterus, unspecified: Secondary | ICD-10-CM

## 2012-07-09 DIAGNOSIS — D252 Subserosal leiomyoma of uterus: Secondary | ICD-10-CM | POA: Insufficient documentation

## 2012-07-09 DIAGNOSIS — N8000 Endometriosis of the uterus, unspecified: Secondary | ICD-10-CM | POA: Insufficient documentation

## 2012-07-09 DIAGNOSIS — D500 Iron deficiency anemia secondary to blood loss (chronic): Secondary | ICD-10-CM | POA: Insufficient documentation

## 2012-07-09 DIAGNOSIS — N736 Female pelvic peritoneal adhesions (postinfective): Secondary | ICD-10-CM

## 2012-07-09 DIAGNOSIS — D250 Submucous leiomyoma of uterus: Principal | ICD-10-CM | POA: Insufficient documentation

## 2012-07-09 DIAGNOSIS — N949 Unspecified condition associated with female genital organs and menstrual cycle: Secondary | ICD-10-CM | POA: Insufficient documentation

## 2012-07-09 HISTORY — PX: ABDOMINAL HYSTERECTOMY: SHX81

## 2012-07-09 SURGERY — HYSTERECTOMY, ABDOMINAL
Anesthesia: General | Site: Abdomen | Wound class: Clean Contaminated

## 2012-07-09 MED ORDER — MIDAZOLAM HCL 5 MG/5ML IJ SOLN
INTRAMUSCULAR | Status: DC | PRN
  Administered 2012-07-09: 2 mg via INTRAVENOUS

## 2012-07-09 MED ORDER — HYDROMORPHONE HCL PF 1 MG/ML IJ SOLN
INTRAMUSCULAR | Status: AC
  Filled 2012-07-09: qty 1

## 2012-07-09 MED ORDER — LIDOCAINE HCL (CARDIAC) 20 MG/ML IV SOLN
INTRAVENOUS | Status: AC
  Filled 2012-07-09: qty 5

## 2012-07-09 MED ORDER — BUPIVACAINE LIPOSOME 1.3 % IJ SUSP
20.0000 mL | Freq: Once | INTRAMUSCULAR | Status: DC
  Filled 2012-07-09: qty 20

## 2012-07-09 MED ORDER — FENTANYL CITRATE 0.05 MG/ML IJ SOLN
INTRAMUSCULAR | Status: AC
  Filled 2012-07-09: qty 2

## 2012-07-09 MED ORDER — GLYCOPYRROLATE 0.2 MG/ML IJ SOLN
INTRAMUSCULAR | Status: AC
  Filled 2012-07-09: qty 2

## 2012-07-09 MED ORDER — FENTANYL CITRATE 0.05 MG/ML IJ SOLN
INTRAMUSCULAR | Status: AC
  Filled 2012-07-09: qty 5

## 2012-07-09 MED ORDER — MIDAZOLAM HCL 2 MG/2ML IJ SOLN
INTRAMUSCULAR | Status: AC
  Filled 2012-07-09: qty 2

## 2012-07-09 MED ORDER — SODIUM CHLORIDE 0.9 % IJ SOLN: 9.0000 mL | INTRAMUSCULAR | Status: DC | PRN

## 2012-07-09 MED ORDER — VASOPRESSIN 20 UNIT/ML IJ SOLN
INTRAVENOUS | Status: DC | PRN
  Administered 2012-07-09: 08:00:00 via INTRAMUSCULAR

## 2012-07-09 MED ORDER — ROCURONIUM BROMIDE 100 MG/10ML IV SOLN
INTRAVENOUS | Status: DC | PRN
  Administered 2012-07-09: 50 mg via INTRAVENOUS
  Administered 2012-07-09 (×2): 10 mg via INTRAVENOUS

## 2012-07-09 MED ORDER — ONDANSETRON HCL 4 MG/2ML IJ SOLN
INTRAMUSCULAR | Status: DC | PRN
  Administered 2012-07-09: 4 mg via INTRAVENOUS

## 2012-07-09 MED ORDER — HYDROMORPHONE HCL PF 1 MG/ML IJ SOLN
INTRAMUSCULAR | Status: DC | PRN
  Administered 2012-07-09: 1 mg via INTRAVENOUS

## 2012-07-09 MED ORDER — FENTANYL CITRATE 0.05 MG/ML IJ SOLN
25.0000 ug | INTRAMUSCULAR | Status: DC | PRN
  Administered 2012-07-09 (×3): 50 ug via INTRAVENOUS

## 2012-07-09 MED ORDER — MEPERIDINE HCL 25 MG/ML IJ SOLN: 6.2500 mg | INTRAMUSCULAR | Status: DC | PRN

## 2012-07-09 MED ORDER — LACTATED RINGERS IV SOLN
INTRAVENOUS | Status: DC
  Administered 2012-07-09: 08:00:00 via INTRAVENOUS
  Administered 2012-07-09: 50 mL/h via INTRAVENOUS

## 2012-07-09 MED ORDER — SODIUM CHLORIDE 0.9 % IJ SOLN: Freq: Once | INTRAMUSCULAR | Status: DC

## 2012-07-09 MED ORDER — NEOSTIGMINE METHYLSULFATE 1 MG/ML IJ SOLN
INTRAMUSCULAR | Status: AC
  Filled 2012-07-09: qty 1

## 2012-07-09 MED ORDER — DEXTROSE-NACL 5-0.9 % IV SOLN: INTRAVENOUS | Status: DC

## 2012-07-09 MED ORDER — PROPOFOL 10 MG/ML IV EMUL
INTRAVENOUS | Status: AC
  Filled 2012-07-09: qty 20

## 2012-07-09 MED ORDER — DEXAMETHASONE SODIUM PHOSPHATE 10 MG/ML IJ SOLN
INTRAMUSCULAR | Status: AC
  Filled 2012-07-09: qty 1

## 2012-07-09 MED ORDER — DIPHENHYDRAMINE HCL 50 MG/ML IJ SOLN: 12.5000 mg | Freq: Four times a day (QID) | INTRAMUSCULAR | Status: DC | PRN

## 2012-07-09 MED ORDER — PROMETHAZINE HCL 25 MG/ML IJ SOLN: 6.2500 mg | INTRAMUSCULAR | Status: DC | PRN

## 2012-07-09 MED ORDER — ROCURONIUM BROMIDE 50 MG/5ML IV SOLN
INTRAVENOUS | Status: AC
  Filled 2012-07-09: qty 1

## 2012-07-09 MED ORDER — FENTANYL CITRATE 0.05 MG/ML IJ SOLN
INTRAMUSCULAR | Status: DC | PRN
  Administered 2012-07-09 (×7): 50 ug via INTRAVENOUS

## 2012-07-09 MED ORDER — MIDAZOLAM HCL 2 MG/2ML IJ SOLN: 0.5000 mg | Freq: Once | INTRAMUSCULAR | Status: DC | PRN

## 2012-07-09 MED ORDER — MORPHINE SULFATE (PF) 1 MG/ML IV SOLN
INTRAVENOUS | Status: DC
  Administered 2012-07-09: 24 mg via INTRAVENOUS
  Administered 2012-07-09: 1.95 mg via INTRAVENOUS
  Administered 2012-07-09 (×2): via INTRAVENOUS
  Administered 2012-07-10: 3 mg via INTRAVENOUS
  Administered 2012-07-10: 1.5 mg via INTRAVENOUS
  Administered 2012-07-10: 3 mg via INTRAVENOUS
  Filled 2012-07-09 (×2): qty 25

## 2012-07-09 MED ORDER — NALOXONE HCL 0.4 MG/ML IJ SOLN: 0.4000 mg | INTRAMUSCULAR | Status: DC | PRN

## 2012-07-09 MED ORDER — VASOPRESSIN 20 UNIT/ML IJ SOLN
INTRAMUSCULAR | Status: AC
  Filled 2012-07-09: qty 1

## 2012-07-09 MED ORDER — DEXAMETHASONE SODIUM PHOSPHATE 4 MG/ML IJ SOLN
INTRAMUSCULAR | Status: DC | PRN
  Administered 2012-07-09: 10 mg via INTRAVENOUS

## 2012-07-09 MED ORDER — PROPOFOL 10 MG/ML IV BOLUS
INTRAVENOUS | Status: DC | PRN
  Administered 2012-07-09: 180 mg via INTRAVENOUS

## 2012-07-09 MED ORDER — ONDANSETRON HCL 4 MG/2ML IJ SOLN
INTRAMUSCULAR | Status: AC
  Filled 2012-07-09: qty 2

## 2012-07-09 MED ORDER — KETOROLAC TROMETHAMINE 30 MG/ML IJ SOLN: 15.0000 mg | Freq: Once | INTRAMUSCULAR | Status: DC | PRN

## 2012-07-09 MED ORDER — ONDANSETRON HCL 4 MG/2ML IJ SOLN: 4.0000 mg | Freq: Four times a day (QID) | INTRAMUSCULAR | Status: DC | PRN

## 2012-07-09 MED ORDER — KETOROLAC TROMETHAMINE 30 MG/ML IJ SOLN
INTRAMUSCULAR | Status: AC
  Filled 2012-07-09: qty 1

## 2012-07-09 MED ORDER — LIDOCAINE HCL (CARDIAC) 20 MG/ML IV SOLN
INTRAVENOUS | Status: DC | PRN
  Administered 2012-07-09: 30 mg via INTRAVENOUS
  Administered 2012-07-09: 70 mg via INTRAVENOUS

## 2012-07-09 MED ORDER — DIPHENHYDRAMINE HCL 12.5 MG/5ML PO ELIX: 12.5000 mg | ORAL_SOLUTION | Freq: Four times a day (QID) | ORAL | Status: DC | PRN

## 2012-07-09 MED ORDER — GLYCOPYRROLATE 0.2 MG/ML IJ SOLN
INTRAMUSCULAR | Status: DC | PRN
  Administered 2012-07-09: 0.3 mg via INTRAVENOUS
  Administered 2012-07-09: 0.6 mg via INTRAVENOUS

## 2012-07-09 MED ORDER — BUPIVACAINE LIPOSOME 1.3 % IJ SUSP
INTRAMUSCULAR | Status: DC | PRN
  Administered 2012-07-09: 20 mL

## 2012-07-09 MED ORDER — NEOSTIGMINE METHYLSULFATE 1 MG/ML IJ SOLN
INTRAMUSCULAR | Status: DC | PRN
  Administered 2012-07-09: 4 mg via INTRAVENOUS

## 2012-07-09 MED ORDER — SODIUM CHLORIDE 0.9 % IJ SOLN
INTRAMUSCULAR | Status: DC | PRN
  Administered 2012-07-09: 30 mL

## 2012-07-09 SURGICAL SUPPLY — 41 items
BENZOIN TINCTURE PRP APPL 2/3 (GAUZE/BANDAGES/DRESSINGS) ×2 IMPLANT
CANISTER SUCTION 2500CC (MISCELLANEOUS) ×2 IMPLANT
CLOTH BEACON ORANGE TIMEOUT ST (SAFETY) ×2 IMPLANT
CONT SPEC PATH 64OZ SNAP LID (MISCELLANEOUS) ×2 IMPLANT
DECANTER SPIKE VIAL GLASS SM (MISCELLANEOUS) ×10 IMPLANT
DRESSING TELFA 8X3 (GAUZE/BANDAGES/DRESSINGS) ×2 IMPLANT
ELECT REM PT RETURN 9FT ADLT (ELECTROSURGICAL) ×2
ELECTRODE REM PT RTRN 9FT ADLT (ELECTROSURGICAL) ×1 IMPLANT
GLOVE BIO SURGEON STRL SZ7.5 (GLOVE) ×4 IMPLANT
GLOVE BIOGEL PI IND STRL 7.0 (GLOVE) ×4 IMPLANT
GLOVE BIOGEL PI IND STRL 7.5 (GLOVE) ×2 IMPLANT
GLOVE BIOGEL PI IND STRL 8 (GLOVE) ×2 IMPLANT
GLOVE BIOGEL PI INDICATOR 7.0 (GLOVE) ×4
GLOVE BIOGEL PI INDICATOR 7.5 (GLOVE) ×2
GLOVE BIOGEL PI INDICATOR 8 (GLOVE) ×2
GLOVE ECLIPSE 7.5 STRL STRAW (GLOVE) ×6 IMPLANT
GLOVE SURG SS PI 6.5 STRL IVOR (GLOVE) ×6 IMPLANT
GLOVE SURG SS PI 7.5 STRL IVOR (GLOVE) ×6 IMPLANT
GOWN STRL REIN XL XLG (GOWN DISPOSABLE) ×8 IMPLANT
HEMOSTAT SURGICEL 2X3 (HEMOSTASIS) ×2 IMPLANT
NEEDLE BLUNT 18X1 FOR OR ONLY (NEEDLE) ×4 IMPLANT
NS IRRIG 1000ML POUR BTL (IV SOLUTION) ×4 IMPLANT
PACK ABDOMINAL GYN (CUSTOM PROCEDURE TRAY) ×2 IMPLANT
PAD ABD 7.5X8 STRL (GAUZE/BANDAGES/DRESSINGS) ×2 IMPLANT
PAD OB MATERNITY 4.3X12.25 (PERSONAL CARE ITEMS) ×2 IMPLANT
PROTECTOR NERVE ULNAR (MISCELLANEOUS) ×2 IMPLANT
SLEEVE SURGEON STRL (DRAPES) ×2 IMPLANT
SPONGE GAUZE 4X4 12PLY (GAUZE/BANDAGES/DRESSINGS) ×2 IMPLANT
SPONGE LAP 18X18 X RAY DECT (DISPOSABLE) ×10 IMPLANT
STRIP CLOSURE SKIN 1/2X4 (GAUZE/BANDAGES/DRESSINGS) ×2 IMPLANT
SUT VIC AB 0 CT1 18XCR BRD8 (SUTURE) ×3 IMPLANT
SUT VIC AB 0 CT1 27 (SUTURE) ×6
SUT VIC AB 0 CT1 27XBRD ANBCTR (SUTURE) ×3 IMPLANT
SUT VIC AB 0 CT1 8-18 (SUTURE) ×3
SUT VICRYL 0 TIES 12 18 (SUTURE) ×2 IMPLANT
SYR 5ML LL (SYRINGE) ×2 IMPLANT
SYR CONTROL 10ML LL (SYRINGE) ×6 IMPLANT
SYRINGE 10CC LL (SYRINGE) ×4 IMPLANT
TAPE CLOTH SURG 4X10 WHT LF (GAUZE/BANDAGES/DRESSINGS) ×2 IMPLANT
TOWEL OR 17X24 6PK STRL BLUE (TOWEL DISPOSABLE) ×6 IMPLANT
TRAY FOLEY CATH 14FR (SET/KITS/TRAYS/PACK) ×2 IMPLANT

## 2012-07-09 NOTE — Op Note (Signed)
Kristine Hayes 05/28/1970 [id number]   Post Operative Note   Date of surgery:  07/09/2012  Pre Op Dx:  Leiomyoma, pelvic pain, menorrhagia, anemia  Post Op Dx:  Leiomyoma, pelvic pain, menorrhagia, anemia, pelvic adhesions  Procedure:  Total abdominal hysterectomy, lysis of adhesions  Surgeon:  Dara Lords  Assistant:  Reynaldo Minium  Anesthesia:  General  EBL:  400 cc  Complications:  None  Specimen:  Uterus to pathology, clinical weight 1700 g  Findings: EUA:  Bulky pelvic mass to above the umbilicus, firm filling the pelvis.   Operative:   Uterus grossly enlarged with multiple myomas. The omental to posterior uterine serosal adhesions. Thick anterior uterine anterior abdominal wall adhesions. Right and left fallopian tubes grossly normal. Right and left ovaries grossly normal. Upper abdominal exam grossly normal to palpation, appendix not visualized.  Procedure:  The patient was taken to the operating room, underwent general anesthesia, received an abdominal/perineal/vaginal preparation with Betadine solution per nursing personnel and a Foley catheter was placed in sterile technique. A time out was performed by the surgical team. The patient was draped in the usual fashion. The abdomen was sharply entered through a Pfannenstiel incision achieving adequate hemostasis at all levels. The uterus was elevated from the incision and posterior uterine omental adhesions were sharply lysed without difficulty. The right and left uterine ovarian pedicles were identified, doubly clamped cut and doubly ligated using 0 Vicryl suture to aid in elevating the uterus. The right and left round ligaments were also identified ligated using 0 Vicryl suture and transected with electrocautery. The thick band of adhesions from the anterior lower abdomen to the lower uterine segment were sharply and bluntly developed and transected freeing up the anterior vesicouterine space. The anterior vesicouterine  peritoneal reflection was then sharply incised from round ligament to round ligament and the bladder flap was sharply and bluntly developed without difficulty. Due to the bulk of the uterus it was decided to proceed with a supracervical hysterectomy initially to allow for better visualization and the right and left uterine vessels were identified, skeletonized and clamped cut and doubly ligated using 0 Vicryl suture. The uterine corpus was then excised from the upper cervix and the cervical stump grasped with Coker clamps. At this point the Balfour retractor and bladder blade were placed within the incision and the intestines were packed from the operative field. The anterior bladder flap was continually developed through sharp and blunt dissection and the paracervical tissues/cardinal ligaments were clamped cut and ligated progressively to free the cervix. The upper vagina was then crossclamped and the cervix was excised from the patient, inspected to assure complete excision. Right and left angle sutures were then placed using 0 Vicryl suture and tagged for future reference. The intervening vaginal mucosa was then reapproximated anterior to posterior in an interrupted figure-of-eight stitch. The pelvis was then copiously irrigated showing adequate hemostasis. There were 2 small oozing areas in both the right and left angle of the vaginal cuffs and Surgicel was placed to achieve ultimate hemostasis. The bowel packing was then removed. The Balfour retractor and bladder blade removed and the anterior fascia was reapproximated using 0 Vicryl suture in a running stitch starting at the angle and meeting in the middle. The incision was copiously irrigated with ultimate hemostasis achieved with electrocautery and the peri-incisional subcutaneous tissues were injected using Exparel. The skin was reapproximated using 4-0 Vicryl in a running subcuticular stitch, benzoin with Steri-Strips applied and a sterile pressure  dressing applied. The patient was  then awakened without difficulty and taken to the recovery room in good condition having tolerated the procedure well with free-flowing clear yellow urine in her Foley catheter.    Dara Lords MD, 9:59 AM 07/09/2012

## 2012-07-09 NOTE — Anesthesia Postprocedure Evaluation (Signed)
  Anesthesia Post-op Note  Patient: Kristine Hayes  Procedure(s) Performed: Procedure(s): HYSTERECTOMY ABDOMINAL (N/A)  Patient Location: [facility]  Anesthesia Type:General  Level of Consciousness: awake, alert  and oriented  Airway and Oxygen Therapy: Patient Spontanous Breathing and Patient connected to nasal cannula oxygen  Post-op Pain: mild  Post-op Assessment: Post-op Vital signs reviewed and Patient's Cardiovascular Status Stable  Post-op Vital Signs: Reviewed and stable  Complications: No apparent anesthesia complications

## 2012-07-09 NOTE — Progress Notes (Signed)
In to see patient  Awake, Alert with good pain relief   Afeb, VSS Lungs clear Cardiac rr without rubs, murmers, gallops Abd soft, dressing dry Ext  Without tendernass  Abundant clear yellow urine in the foley  A/P:  Doing well s/p TAH.  Results of surgery reviewed with the patient.  Continue routine PO care.

## 2012-07-09 NOTE — H&P (Signed)
  The patient was examined.  I reviewed the proposed surgery and consent form with the patient.  The dictated history and physical is current and accurate and all questions were answered. The patient is ready to proceed with surgery and has a realistic understanding and expectation for the outcome.   Dara Lords MD, 7:04 AM 07/09/2012

## 2012-07-09 NOTE — Anesthesia Postprocedure Evaluation (Signed)
  Anesthesia Post-op Note  Patient: Kristine Hayes  Procedure(s) Performed: Procedure(s): HYSTERECTOMY ABDOMINAL (N/A) Patient is awake and responsive. Pain and nausea are reasonably well controlled. Vital signs are stable and clinically acceptable. Oxygen saturation is clinically acceptable. There are no apparent anesthetic complications at this time. Patient is ready for discharge.

## 2012-07-09 NOTE — Anesthesia Preprocedure Evaluation (Addendum)
Anesthesia Evaluation  Patient identified by MRN, date of birth, ID band Patient awake    Reviewed: Allergy & Precautions, H&P , Patient's Chart, lab work & pertinent test results  Airway Mallampati: III TM Distance: >3 FB Neck ROM: full    Dental no notable dental hx.    Pulmonary neg pulmonary ROS, shortness of breath, sleep apnea ,  breath sounds clear to auscultation  Pulmonary exam normal       Cardiovascular hypertension, negative cardio ROS  Rhythm:regular Rate:Normal     Neuro/Psych  Neuromuscular disease negative neurological ROS  negative psych ROS   GI/Hepatic negative GI ROS, Neg liver ROS, PUD,   Endo/Other  negative endocrine ROSMorbid obesity  Renal/GU negative Renal ROS     Musculoskeletal   Abdominal   Peds  Hematology negative hematology ROS (+) anemia ,   Anesthesia Other Findings Fibroid     IUD   MIRENA INSERTED 6/2-1- AND REMOVED ON 01-20-09    Lupus   DX-SKIN BIOPSY-10 Anemia   IRON DEFICIENT    Hypertension   BORDERLINE Neuromuscular disorder- tingling in toes L side       Shortness of breath   on exertion due to wt gain per pt Sleep apnea   mild-no CPAP    Reproductive/Obstetrics                         Anesthesia Physical Anesthesia Plan  ASA: III  Anesthesia Plan: General ETT   Post-op Pain Management:    Induction:   Airway Management Planned:   Additional Equipment:   Intra-op Plan:   Post-operative Plan:   Informed Consent: I have reviewed the patients History and Physical, chart, labs and discussed the procedure including the risks, benefits and alternatives for the proposed anesthesia with the patient or authorized representative who has indicated his/her understanding and acceptance.     Plan Discussed with:   Anesthesia Plan Comments:        Anesthesia Quick Evaluation

## 2012-07-09 NOTE — Transfer of Care (Signed)
Immediate Anesthesia Transfer of Care Note  Patient: Kristine Hayes  Procedure(s) Performed: Procedure(s): HYSTERECTOMY ABDOMINAL (N/A)  Patient Location: PACU  Anesthesia Type:General  Level of Consciousness: awake, sedated and patient cooperative  Airway & Oxygen Therapy: Patient Spontanous Breathing and Patient connected to nasal cannula oxygen  Post-op Assessment: Report given to PACU RN and Post -op Vital signs reviewed and stable  Post vital signs: Reviewed and stable  Complications: No apparent anesthesia complications

## 2012-07-10 ENCOUNTER — Encounter (HOSPITAL_COMMUNITY): Payer: Self-pay | Admitting: Gynecology

## 2012-07-10 LAB — CBC
MCH: 27.4 pg (ref 26.0–34.0)
MCV: 81.4 fL (ref 78.0–100.0)
Platelets: 219 10*3/uL (ref 150–400)
RDW: 17 % — ABNORMAL HIGH (ref 11.5–15.5)

## 2012-07-10 MED ORDER — HYDROCODONE-ACETAMINOPHEN 5-325 MG PO TABS
2.0000 | ORAL_TABLET | ORAL | Status: DC | PRN
  Administered 2012-07-10 – 2012-07-11 (×2): 2 via ORAL
  Filled 2012-07-10 (×2): qty 2

## 2012-07-10 MED ORDER — IBUPROFEN 800 MG PO TABS
800.0000 mg | ORAL_TABLET | Freq: Three times a day (TID) | ORAL | Status: DC
  Administered 2012-07-10 – 2012-07-11 (×4): 800 mg via ORAL
  Filled 2012-07-10 (×4): qty 1

## 2012-07-10 NOTE — Progress Notes (Signed)
Patient ID: Kristine Hayes, female   DOB: 04/27/1970, 42 y.o.   MRN: [id number] Doing well  Eating, ambulating, voiding with good pain relief with oral meds  Dressing dry.  Abdomen with minimal tenderness   Plan tentative discharge in AM

## 2012-07-10 NOTE — Progress Notes (Signed)
Patient ID: Kristine Hayes, female   DOB: 14-Jul-1970, 42 y.o.   MRN: [id number] Kristine Hayes 1970/03/01 [id number]   1 Day Post-Op s/p Procedure(s): HYSTERECTOMY ABDOMINAL  Subjective: Patient reports no acute distress, pain severity reported moderate, yes taking PO, foley catheter out, yes voiding, yes ambulating, no passing flatus  Objective: Vital signs in last 24 hours: Temp:  [98.1 F (36.7 C)-99.1 F (37.3 C)] 98.4 F (36.9 C) (06/24 0533) Pulse Rate:  [61-80] 73 (06/24 0533) Resp:  [13-39] 20 (06/24 0617) BP: (120-152)/(68-91) 122/68 mmHg (06/24 0533) SpO2:  [91 %-100 %] 100 % (06/24 0617) Weight:  [270 lb (122.471 kg)] 270 lb (122.471 kg) (06/23 1106) Last BM Date: 07/08/12  EXAM General: awake, mild distress Resp: rhonchi clear to auscultation bilaterally Cardio: regular rate and rhythm, S1, S2 normal, no murmur, click, rub or gallop GI: soft, mild tenderness, bowel sounds hypoactive, dressing dry Lower Extremities: Without swelling or tenderness Vaginal Bleeding: scant  Lab Results:   Recent Labs  07/10/12 0525  WBC 12.3*  HGB 11.2*  HCT 33.3*  PLT 219    Assessment: s/p Procedure(s): HYSTERECTOMY ABDOMINAL: stable and progressing well, Hb good, ambulating, voiding, drinking.  Plan: Continue routine post operative care  Advance diet Encourage ambulation Advance to PO medication Discontinue IV fluids  LOS: 1 day    Dara Lords, MD 07/10/2012 8:33 AM

## 2012-07-11 MED ORDER — HYDROCODONE-ACETAMINOPHEN 5-325 MG PO TABS: 2.0000 | ORAL_TABLET | ORAL | Status: DC | PRN

## 2012-07-11 NOTE — Progress Notes (Signed)
Discharge instructions reviewed with patient.  Patient states understanding of home care, activity, medications, signs/symptoms to report to MD and return MD office visit.  No home equipment needed.  Discharged per wheelchair in stable condition with staff without incident.

## 2012-07-11 NOTE — Discharge Summary (Signed)
  Kristine Hayes 1970-08-22 [id number]   Discharge Summary  Date of Admission:  07/09/2012  Date of Discharge:  07/11/2012  Discharge Diagnosis:  Leiomyoma, pelvic pain, menorrhagia, anemia, endometriosis, adenomyosis  Procedure:  Procedure(s): HYSTERECTOMY ABDOMINAL  Pathology:  Uterus and cervix, and fibroids - LEIOMYOMATA. - ADENOMYOSIS. - ENDOMETRIUM: BENIGN WEAKLY PROLIFERATIVE ENDOMETRIUM, NO ATYPIA, HYPERPLASIA OR MALIGNANCY. - CERVIX: BENIGN SQUAMOUS MUCOSA AND ENDOCERVICAL MUCOSA, NO DYSPLASIA OR MALIGNANCY. - UTERINE SEROSA: ENDOMETRIOSIS WITH ASSOCIATED ADHESIONS, NO EVIDENCE OF ATYPIA OR MALIGNANCY.   Hospital Course:  The patient underwent an uncomplicated total abdominal hysterectomy 07/09/2012. Her postoperative course was uncomplicated and she was discharged on postoperative day #2 ambulating well, tolerating a regular diet, voiding without difficulty, receiving good pain relief from her oral medication. Postoperative hemoglobin was 11.2. The patient received instructions for postoperative care and call precautions.  She received prescriptions per AVS and will be seen in the office 2 weeks following discharge.       Dara Lords MD, 12:31 PM 07/11/2012

## 2012-07-11 NOTE — Progress Notes (Signed)
Patient ID: Kristine Hayes, female   DOB: 1971/01/07, 42 y.o.   MRN: [id number] Kristine Hayes Jan 29, 1970 [id number]   2 Days Post-Op Procedure(s) (LRB): HYSTERECTOMY ABDOMINAL (N/A)  Subjective: Patient reports feels well, no acute distress, pain severity reported mild, yes taking PO, foley catheter out, yes voiding, yes ambulating, yes passing flatus  Objective: Vital signs in last 24 hours: Temp:  [98.4 F (36.9 C)-99.3 F (37.4 C)] 98.4 F (36.9 C) (06/25 0536) Pulse Rate:  [67-84] 67 (06/25 0536) Resp:  [16-20] 18 (06/25 0536) BP: (110-133)/(59-82) 110/59 mmHg (06/25 0536) SpO2:  [97 %-100 %] 100 % (06/25 0536) Last BM Date: 07/08/12    EXAM General: awake, alert and no distress Resp: rhonchi clear to auscultation bilaterally Cardio: regular rate and rhythm, S1, S2 normal, no murmur, click, rub or gallop GI: soft, minimal tender, bowel sounds active, incisions dry intact Lower Extremities: Without swelling or tenderness Vaginal Bleeding: Reported scant   Lab Results:   Recent Labs  07/10/12 0525  WBC 12.3*  HGB 11.2*  HCT 33.3*  PLT 219    Assessment: s/p Procedure(s): HYSTERECTOMY ABDOMINAL: progressing well, ready for discharge.    Plan: Discharge home today.  Precautions, instructions and follow up were discussed with the patient.  Prescriptions provided per AVS.  Patient to call the office to arrange a post-operative appointmant in 2 weeks.  Dara Lords, MD 07/11/2012 7:59 AM

## 2012-07-26 ENCOUNTER — Encounter: Payer: Self-pay | Admitting: Gynecology

## 2012-07-26 ENCOUNTER — Ambulatory Visit (INDEPENDENT_AMBULATORY_CARE_PROVIDER_SITE_OTHER): Payer: 59 | Admitting: Gynecology

## 2012-07-26 VITALS — BP 128/82

## 2012-07-26 DIAGNOSIS — Z9889 Other specified postprocedural states: Secondary | ICD-10-CM

## 2012-07-26 NOTE — Progress Notes (Signed)
First two-week postop visit status post TAH. Doing well without complaints. Exam was [facility] assistant Abdomen soft nontender with incision line healing nicely. No masses guarding rebound organomegaly. Pelvic external BUS vagina with cuff healing nicely. Bimanual without masses or tenderness.  Assessment and plan: TAH for leiomyoma. Pathology showed 1660 g uterus with leiomyoma and endometriosis. Patient will slowly resume activities with the exception of pelvic rest and represent in 2 weeks for her next postoperative visit.

## 2012-07-26 NOTE — Patient Instructions (Signed)
Followup in 2 weeks for your next postoperative visit. Nothing in the vagina but otherwise slowly resume normal activities.

## 2012-08-08 ENCOUNTER — Ambulatory Visit (INDEPENDENT_AMBULATORY_CARE_PROVIDER_SITE_OTHER): Payer: 59 | Admitting: Gynecology

## 2012-08-08 ENCOUNTER — Encounter: Payer: Self-pay | Admitting: Gynecology

## 2012-08-08 DIAGNOSIS — Z9889 Other specified postprocedural states: Secondary | ICD-10-CM

## 2012-08-08 NOTE — Patient Instructions (Signed)
Slowly resume activities.  Nothing in vagina for 2 more weeks

## 2012-08-08 NOTE — Progress Notes (Signed)
4 week postop visit status post TAH, doing well.  Exam with Sherrilyn Rist Asst. Abdomen soft nontender without masses guarding rebound organomegaly. Incision well healed. Pelvic external BUS vagina normal with cuff healing nicely. Bimanual without masses or tenderness.  Assessment and plan: Normal postop check. We'll slowly resume all normal activities. Continue pelvic rest for 2 more weeks. Followup in October when she is due for annual exam, sooner as needed.

## 2012-08-14 ENCOUNTER — Telehealth: Payer: Self-pay

## 2012-08-14 NOTE — Telephone Encounter (Signed)
Copy of note and letter stating okay for desk work (copy in [employer] chart) faxed to disability co.

## 2012-08-14 NOTE — Telephone Encounter (Signed)
Patient needs to sign release for copy of my last note.  Sitting at a desk is ok.

## 2012-08-14 NOTE — Telephone Encounter (Signed)
Disability Co[employer] Called to follow up regarding patient's recovery.  They had been informed from the beginning that you advised 6 weeks out of work and she would return on August 5th. Now they are asking for note from her last visit. The rep also stated that the patient's job allows her to be sitting at a desk and they need to know what is preventing her from returning to work?

## 2012-08-14 NOTE — Telephone Encounter (Signed)
We have records release on file for this disability.  I did contact patient and let her know ins co was being made aware she could sit at desk now.

## 2012-09-20 ENCOUNTER — Other Ambulatory Visit: Payer: Self-pay

## 2012-09-20 DIAGNOSIS — Z1231 Encounter for screening mammogram for malignant neoplasm of breast: Secondary | ICD-10-CM

## 2012-10-10 ENCOUNTER — Ambulatory Visit
Admission: RE | Admit: 2012-10-10 | Discharge: 2012-10-10 | Disposition: A | Discharge status: Home / Self Care | Payer: 59 | Source: Ambulatory Visit

## 2012-10-10 DIAGNOSIS — Z1231 Encounter for screening mammogram for malignant neoplasm of breast: Secondary | ICD-10-CM

## 2012-11-07 ENCOUNTER — Ambulatory Visit (INDEPENDENT_AMBULATORY_CARE_PROVIDER_SITE_OTHER): Payer: 59 | Admitting: Gynecology

## 2012-11-07 ENCOUNTER — Encounter: Payer: Self-pay | Admitting: Gynecology

## 2012-11-07 VITALS — BP 130/84 | Ht 66.0 in | Wt 282.0 lb

## 2012-11-07 DIAGNOSIS — Z01419 Encounter for gynecological examination (general) (routine) without abnormal findings: Secondary | ICD-10-CM

## 2012-11-07 DIAGNOSIS — N393 Stress incontinence (female) (male): Secondary | ICD-10-CM

## 2012-11-07 NOTE — Patient Instructions (Signed)
Follow up in one year, sooner as needed. 

## 2012-11-07 NOTE — Progress Notes (Signed)
Kristine Hayes [birth year] [id number]        42 y.o.  G2P0020 for annual exam.  Several issues noted below.  Past medical history,surgical history, medications, allergies, family history and social history were all reviewed and documented in the EPIC chart.  ROS:  Performed and pertinent positives and negatives are included in the history, assessment and plan .  Exam: [name] assistant Filed Vitals:   11/07/12 1612  BP: 130/84  Height: 5\' 6"  (1.676 m)  Weight: 282 lb (127.914 kg)   General appearance  Normal Skin grossly normal Head/Neck normal with no cervical or supraclavicular adenopathy thyroid normal Lungs  clear Cardiac RR, without RMG Abdominal  soft, nontender, without masses, organomegaly or hernia Breasts  examined lying and sitting without masses, retractions, discharge or axillary adenopathy. Pelvic  Ext/BUS/vagina  normal   Adnexa  Without masses or tenderness    Anus and perineum  normal   Rectovaginal  normal sphincter tone without palpated masses or tenderness.    Assessment/Plan:  42 y.o. G83P0020 female for annual exam.   1. Status post TAH 06/2012 for leiomyoma. Doing well without complaints. Will continue to monitor. 2. SUI symptoms. Patient does have some loss of urine with coughing sneezing laughing. Exam is normal without significant cystocele. Options for management include expectant with Kegel exercises, avoidance of bladder irritants such as caffeine and carbonated beverages up to and including surgery such as sling procedures reviewed. Patient wants to try Kegel exercises. Will call if she wants to pursue surgery and I will referred to urology. 3. Lower sternal discomfort. Patient's pointing to her xiphoid process noting that becomes uncomfortable at times. This is right where her bra comes across and I think is probably being irritation by this. Patient's going to monitor at present and if it worsens/continues will refer for further evaluation. Patient's going to  try a different bra. 4. Mammography 09/2012. Continue with annual mammography. SBE monthly reviewed. 5. Pap smear 2012. No Pap smear done today. No history of abnormal Pap smears previously. Status post hysterectomy for benign indications. Options to stop screening altogether or less frequent screening intervals reviewed. Will readdress on an annual basis. 6. Colonoscopy 2011. Patient's currently being evaluated for colitis and will continue to see her gastroenterologist. 7. Health maintenance. No routine blood work done as this is reportedly done through her other physician's offices. Followup one year, sooner as needed.  Note: This document was prepared with digital dictation and possible smart phrase technology. Any transcriptional errors that result from this process are unintentional.   Dara Lords MD, 4:45 PM 11/07/2012

## 2013-09-25 ENCOUNTER — Other Ambulatory Visit: Payer: Self-pay

## 2013-09-25 DIAGNOSIS — Z1231 Encounter for screening mammogram for malignant neoplasm of breast: Secondary | ICD-10-CM

## 2013-09-27 ENCOUNTER — Encounter: Payer: Self-pay | Admitting: Internal Medicine

## 2013-10-11 ENCOUNTER — Ambulatory Visit
Admission: RE | Admit: 2013-10-11 | Discharge: 2013-10-11 | Disposition: A | Discharge status: Home / Self Care | Payer: 59 | Source: Ambulatory Visit

## 2013-10-11 DIAGNOSIS — Z1231 Encounter for screening mammogram for malignant neoplasm of breast: Secondary | ICD-10-CM

## 2013-11-08 ENCOUNTER — Ambulatory Visit (INDEPENDENT_AMBULATORY_CARE_PROVIDER_SITE_OTHER): Payer: 59 | Admitting: Gynecology

## 2013-11-08 ENCOUNTER — Encounter: Payer: Self-pay | Admitting: Gynecology

## 2013-11-08 ENCOUNTER — Other Ambulatory Visit (HOSPITAL_COMMUNITY)
Admission: RE | Admit: 2013-11-08 | Discharge: 2013-11-08 | Disposition: A | Discharge status: Home / Self Care | Payer: 59 | Source: Ambulatory Visit | Attending: Gynecology | Admitting: Gynecology

## 2013-11-08 VITALS — BP 124/80 | Ht 66.0 in | Wt 272.0 lb

## 2013-11-08 DIAGNOSIS — Z01419 Encounter for gynecological examination (general) (routine) without abnormal findings: Secondary | ICD-10-CM

## 2013-11-08 DIAGNOSIS — R32 Unspecified urinary incontinence: Secondary | ICD-10-CM

## 2013-11-08 LAB — CBC WITH DIFFERENTIAL/PLATELET
Basophils Absolute: 0.1 10*3/uL (ref 0.0–0.1)
Basophils Relative: 1 % (ref 0–1)
EOS ABS: 0.3 10*3/uL (ref 0.0–0.7)
EOS PCT: 4 % (ref 0–5)
HCT: 38.8 % (ref 36.0–46.0)
HEMOGLOBIN: 13 g/dL (ref 12.0–15.0)
LYMPHS ABS: 2.3 10*3/uL (ref 0.7–4.0)
Lymphocytes Relative: 29 % (ref 12–46)
MCH: 29.3 pg (ref 26.0–34.0)
MCHC: 33.5 g/dL (ref 30.0–36.0)
MCV: 87.6 fL (ref 78.0–100.0)
MONOS PCT: 9 % (ref 3–12)
Monocytes Absolute: 0.7 10*3/uL (ref 0.1–1.0)
Neutro Abs: 4.4 10*3/uL (ref 1.7–7.7)
Neutrophils Relative %: 57 % (ref 43–77)
Platelets: 257 10*3/uL (ref 150–400)
RBC: 4.43 MIL/uL (ref 3.87–5.11)
RDW: 13.5 % (ref 11.5–15.5)
WBC: 7.8 10*3/uL (ref 4.0–10.5)

## 2013-11-08 NOTE — Progress Notes (Signed)
Kristine Hayes 1970/02/04 [id number]        43 y.o.  G2P0020 for annual exam.  Several issues noted below.  Past medical history,surgical history, problem list, medications, allergies, family history and social history were all reviewed and documented as reviewed in the EPIC chart.  ROS:  12 system ROS performed with pertinent positives and negatives included in the history, assessment and plan.   Additional significant findings :  none   Exam: [name] Counsellor Vitals:   11/08/13 1553  BP: 124/80  Height: 5\' 6"  (1.676 m)  Weight: 272 lb (123.378 kg)   General appearance:  Normal affect, orientation and appearance. Skin: Grossly normal HEENT: Without gross lesions.  No cervical or supraclavicular adenopathy. Thyroid normal.  Lungs:  Clear without wheezing, rales or rhonchi Cardiac: RR, without RMG Abdominal:  Soft, nontender, without masses, guarding, rebound, organomegaly or hernia Breasts:  Examined lying and sitting without masses, retractions, discharge or axillary adenopathy. Pelvic:  Ext/BUS/vagina normal. Pap of cuff done  Adnexa  Without masses or tenderness    Anus and perineum  Normal   Rectovaginal  Normal sphincter tone without palpated masses or tenderness.    Assessment/Plan:  43 y.o. G67P0020 female for annual exam.   1. Status post TAH 2014 for leiomyoma/menorrhagia. As done well since then.  Was anemic in the past. We'll check CBC today. 2. Urinary incontinence. Patient notes spontaneous small amounts of loss of urine throughout the day. Does not appear to be associated with coughing sneezing laughing. No urgency, but more dampness.  Exam is normal. Will check urinalysis. Discussed referral to urology if patient desires now or if worsens. Patient is planning on losing weight this coming year and prefer to wait to see if this doesn't help which I think is a good idea. She'll call me though if she wants a referral. 3. Pap smear 2012. Pap of vaginal cuff today. No  history of significant abnormal Pap smears. Reviewed current screening guidelines. Options to stop screening as she is status post hysterectomy for benign indications versus screening unless frequent interval reviewed. We'll readdress on an annual basis. 4. Mammography 09/2013. Continue with annual mammography. SBE monthly reviewed. 5. Colonoscopy 2009. Repeat at age 53. 19. Health maintenance. Patient requested routine blood work. She does have a primary physician but prefers to have it drawn here for now. CBC comprehensive metabolic panel lipid profile urinalysis TSH ordered. Follow up in one year, sooner as needed.     [name] MD, 4:16 PM 11/08/2013

## 2013-11-08 NOTE — Addendum Note (Signed)
Addended by: Nelva Nay on: 11/08/2013 04:23 PM   Modules accepted: Orders

## 2013-11-08 NOTE — Patient Instructions (Signed)
You may obtain a copy of any labs that were done today by logging onto MyChart as outlined in the instructions provided with your AVS (after visit summary). The office will not call with normal lab results but certainly if there are any significant abnormalities then we will contact you.   Health Maintenance, Female A healthy lifestyle and preventative care can promote health and wellness.  Maintain regular health, dental, and eye exams.  Eat a healthy diet. Foods like vegetables, fruits, whole grains, low-fat dairy products, and lean protein foods contain the nutrients you need without too many calories. Decrease your intake of foods high in solid fats, added sugars, and salt. Get information about a proper diet from your caregiver, if necessary.  Regular physical exercise is one of the most important things you can do for your health. Most adults should get at least 150 minutes of moderate-intensity exercise (any activity that increases your heart rate and causes you to sweat) each week. In addition, most adults need muscle-strengthening exercises on 2 or more days a week.   Maintain a healthy weight. The body mass index (BMI) is a screening tool to identify possible weight problems. It provides an estimate of body fat based on height and weight. Your caregiver can help determine your BMI, and can help you achieve or maintain a healthy weight. For adults 20 years and older:  A BMI below 18.5 is considered underweight.  A BMI of 18.5 to 24.9 is normal.  A BMI of 25 to 29.9 is considered overweight.  A BMI of 30 and above is considered obese.  Maintain normal blood lipids and cholesterol by exercising and minimizing your intake of saturated fat. Eat a balanced diet with plenty of fruits and vegetables. Blood tests for lipids and cholesterol should begin at age 61 and be repeated every 5 years. If your lipid or cholesterol levels are high, you are over 50, or you are a high risk for heart  disease, you may need your cholesterol levels checked more frequently.Ongoing high lipid and cholesterol levels should be treated with medicines if diet and exercise are not effective.  If you smoke, find out from your caregiver how to quit. If you do not use tobacco, do not start.  Lung cancer screening is recommended for adults aged 33 80 years who are at high risk for developing lung cancer because of a history of smoking. Yearly low-dose computed tomography (CT) is recommended for people who have at least a 30-pack-year history of smoking and are a current smoker or have quit within the past 15 years. A pack year of smoking is smoking an average of 1 pack of cigarettes a day for 1 year (for example: 1 pack a day for 30 years or 2 packs a day for 15 years). Yearly screening should continue until the smoker has stopped smoking for at least 15 years. Yearly screening should also be stopped for people who develop a health problem that would prevent them from having lung cancer treatment.  If you are pregnant, do not drink alcohol. If you are breastfeeding, be very cautious about drinking alcohol. If you are not pregnant and choose to drink alcohol, do not exceed 1 drink per day. One drink is considered to be 12 ounces (355 mL) of beer, 5 ounces (148 mL) of wine, or 1.5 ounces (44 mL) of liquor.  Avoid use of street drugs. Do not share needles with anyone. Ask for help if you need support or instructions about stopping  the use of drugs.  High blood pressure causes heart disease and increases the risk of stroke. Blood pressure should be checked at least every 1 to 2 years. Ongoing high blood pressure should be treated with medicines, if weight loss and exercise are not effective.  If you are 59 to 45 years old, ask your caregiver if you should take aspirin to prevent strokes.  Diabetes screening involves taking a blood sample to check your fasting blood sugar level. This should be done once every 3  years, after age 91, if you are within normal weight and without risk factors for diabetes. Testing should be considered at a younger age or be carried out more frequently if you are overweight and have at least 1 risk factor for diabetes.  Breast cancer screening is essential preventative care for women. You should practice "breast self-awareness." This means understanding the normal appearance and feel of your breasts and may include breast self-examination. Any changes detected, no matter how small, should be reported to a caregiver. Women in their 66s and 30s should have a clinical breast exam (CBE) by a caregiver as part of a regular health exam every 1 to 3 years. After age 101, women should have a CBE every year. Starting at age 100, women should consider having a mammogram (breast X-ray) every year. Women who have a family history of breast cancer should talk to their caregiver about genetic screening. Women at a high risk of breast cancer should talk to their caregiver about having an MRI and a mammogram every year.  Breast cancer gene (BRCA)-related cancer risk assessment is recommended for women who have family members with BRCA-related cancers. BRCA-related cancers include breast, ovarian, tubal, and peritoneal cancers. Having family members with these cancers may be associated with an increased risk for harmful changes (mutations) in the breast cancer genes BRCA1 and BRCA2. Results of the assessment will determine the need for genetic counseling and BRCA1 and BRCA2 testing.  The Pap test is a screening test for cervical cancer. Women should have a Pap test starting at age 57. Between ages 25 and 35, Pap tests should be repeated every 2 years. Beginning at age 37, you should have a Pap test every 3 years as long as the past 3 Pap tests have been normal. If you had a hysterectomy for a problem that was not cancer or a condition that could lead to cancer, then you no longer need Pap tests. If you are  between ages 50 and 76, and you have had normal Pap tests going back 10 years, you no longer need Pap tests. If you have had past treatment for cervical cancer or a condition that could lead to cancer, you need Pap tests and screening for cancer for at least 20 years after your treatment. If Pap tests have been discontinued, risk factors (such as a new sexual partner) need to be reassessed to determine if screening should be resumed. Some women have medical problems that increase the chance of getting cervical cancer. In these cases, your caregiver may recommend more frequent screening and Pap tests.  The human papillomavirus (HPV) test is an additional test that may be used for cervical cancer screening. The HPV test looks for the virus that can cause the cell changes on the cervix. The cells collected during the Pap test can be tested for HPV. The HPV test could be used to screen women aged 44 years and older, and should be used in women of any age  who have unclear Pap test results. After the age of 55, women should have HPV testing at the same frequency as a Pap test.  Colorectal cancer can be detected and often prevented. Most routine colorectal cancer screening begins at the age of 44 and continues through age 20. However, your caregiver may recommend screening at an earlier age if you have risk factors for colon cancer. On a yearly basis, your caregiver may provide home test kits to check for hidden blood in the stool. Use of a small camera at the end of a tube, to directly examine the colon (sigmoidoscopy or colonoscopy), can detect the earliest forms of colorectal cancer. Talk to your caregiver about this at age 86, when routine screening begins. Direct examination of the colon should be repeated every 5 to 10 years through age 13, unless early forms of pre-cancerous polyps or small growths are found.  Hepatitis C blood testing is recommended for all people born from [birth year] through 1967 and any  individual with known risks for hepatitis C.  Practice safe sex. Use condoms and avoid high-risk sexual practices to reduce the spread of sexually transmitted infections (STIs). Sexually active women aged 36 and younger should be checked for Chlamydia, which is a common sexually transmitted infection. Older women with new or multiple partners should also be tested for Chlamydia. Testing for other STIs is recommended if you are sexually active and at increased risk.  Osteoporosis is a disease in which the bones lose minerals and strength with aging. This can result in serious bone fractures. The risk of osteoporosis can be identified using a bone density scan. Women ages 20 and over and women at risk for fractures or osteoporosis should discuss screening with their caregivers. Ask your caregiver whether you should be taking a calcium supplement or vitamin D to reduce the rate of osteoporosis.  Menopause can be associated with physical symptoms and risks. Hormone replacement therapy is available to decrease symptoms and risks. You should talk to your caregiver about whether hormone replacement therapy is right for you.  Use sunscreen. Apply sunscreen liberally and repeatedly throughout the day. You should seek shade when your shadow is shorter than you. Protect yourself by wearing long sleeves, pants, a wide-brimmed hat, and sunglasses year round, whenever you are outdoors.  Notify your caregiver of new moles or changes in moles, especially if there is a change in shape or color. Also notify your caregiver if a mole is larger than the size of a pencil eraser.  Stay current with your immunizations. Document Released: 07/19/2010 Document Revised: 04/30/2012 Document Reviewed: 07/19/2010 [facility] Patient Information 2014 [employer].

## 2013-11-09 LAB — URINALYSIS W MICROSCOPIC + REFLEX CULTURE
Bacteria, UA: NONE SEEN
Bilirubin Urine: NEGATIVE
CRYSTALS: NONE SEEN
Casts: NONE SEEN
GLUCOSE, UA: NEGATIVE mg/dL
Hgb urine dipstick: NEGATIVE
Ketones, ur: NEGATIVE mg/dL
LEUKOCYTES UA: NEGATIVE
Nitrite: NEGATIVE
PROTEIN: NEGATIVE mg/dL
SPECIFIC GRAVITY, URINE: 1.019 (ref 1.005–1.030)
SQUAMOUS EPITHELIAL / LPF: NONE SEEN
Urobilinogen, UA: 0.2 mg/dL (ref 0.0–1.0)
pH: 5.5 (ref 5.0–8.0)

## 2013-11-09 LAB — LIPID PANEL
Cholesterol: 139 mg/dL (ref 0–200)
HDL: 52 mg/dL (ref >39)
LDL Cholesterol: 58 mg/dL (ref 0–99)
Total CHOL/HDL Ratio: 2.7 Ratio
Triglycerides: 143 mg/dL (ref <150)
VLDL: 29 mg/dL (ref 0–40)

## 2013-11-09 LAB — COMPREHENSIVE METABOLIC PANEL
ALT: 10 U/L (ref 0–35)
AST: 17 U/L (ref 0–37)
Albumin: 4 g/dL (ref 3.5–5.2)
Alkaline Phosphatase: 53 U/L (ref 39–117)
BUN: 13 mg/dL (ref 6–23)
CO2: 24 mEq/L (ref 19–32)
Calcium: 9.6 mg/dL (ref 8.4–10.5)
Chloride: 105 mEq/L (ref 96–112)
Creat: 0.92 mg/dL (ref 0.50–1.10)
Glucose, Bld: 90 mg/dL (ref 70–99)
Potassium: 3.9 mEq/L (ref 3.5–5.3)
Sodium: 138 mEq/L (ref 135–145)
Total Bilirubin: 0.9 mg/dL (ref 0.2–1.2)
Total Protein: 6.8 g/dL (ref 6.0–8.3)

## 2013-11-09 LAB — TSH: TSH: 0.712 u[IU]/mL (ref 0.350–4.500)

## 2013-11-12 LAB — CYTOLOGY - PAP

## 2013-11-18 ENCOUNTER — Encounter: Payer: Self-pay | Admitting: Gynecology

## 2014-10-01 ENCOUNTER — Other Ambulatory Visit: Payer: Self-pay

## 2014-10-01 DIAGNOSIS — Z1231 Encounter for screening mammogram for malignant neoplasm of breast: Secondary | ICD-10-CM

## 2014-10-13 ENCOUNTER — Ambulatory Visit
Admission: RE | Admit: 2014-10-13 | Discharge: 2014-10-13 | Disposition: A | Discharge status: Home / Self Care | Payer: 59 | Source: Ambulatory Visit

## 2014-10-13 DIAGNOSIS — Z1231 Encounter for screening mammogram for malignant neoplasm of breast: Secondary | ICD-10-CM

## 2014-10-17 ENCOUNTER — Other Ambulatory Visit: Payer: Self-pay | Admitting: Gynecology

## 2014-10-17 DIAGNOSIS — R928 Other abnormal and inconclusive findings on diagnostic imaging of breast: Secondary | ICD-10-CM

## 2014-10-28 ENCOUNTER — Other Ambulatory Visit: Payer: Self-pay | Admitting: Gynecology

## 2014-10-28 ENCOUNTER — Ambulatory Visit
Admission: RE | Admit: 2014-10-28 | Discharge: 2014-10-28 | Disposition: A | Discharge status: Home / Self Care | Payer: 59 | Source: Ambulatory Visit | Attending: Gynecology | Admitting: Gynecology

## 2014-10-28 DIAGNOSIS — R928 Other abnormal and inconclusive findings on diagnostic imaging of breast: Secondary | ICD-10-CM

## 2014-10-28 DIAGNOSIS — N6320 Unspecified lump in the left breast, unspecified quadrant: Secondary | ICD-10-CM

## 2014-10-31 ENCOUNTER — Ambulatory Visit
Admission: RE | Admit: 2014-10-31 | Discharge: 2014-10-31 | Disposition: A | Discharge status: Home / Self Care | Payer: 59 | Source: Ambulatory Visit | Attending: Gynecology | Admitting: Gynecology

## 2014-10-31 DIAGNOSIS — N6320 Unspecified lump in the left breast, unspecified quadrant: Secondary | ICD-10-CM

## 2014-11-10 ENCOUNTER — Encounter: Payer: 59 | Admitting: Gynecology

## 2014-11-11 ENCOUNTER — Ambulatory Visit (INDEPENDENT_AMBULATORY_CARE_PROVIDER_SITE_OTHER): Payer: 59 | Admitting: Gynecology

## 2014-11-11 ENCOUNTER — Encounter: Payer: Self-pay | Admitting: Gynecology

## 2014-11-11 VITALS — BP 124/80 | Ht 66.0 in | Wt 281.0 lb

## 2014-11-11 DIAGNOSIS — Z01419 Encounter for gynecological examination (general) (routine) without abnormal findings: Secondary | ICD-10-CM | POA: Diagnosis not present

## 2014-11-11 NOTE — Patient Instructions (Signed)

## 2014-11-11 NOTE — Progress Notes (Signed)
Kristine Hayes [birth year] [id number]        44 y.o.  G2P0020 for annual exam.  Doing well without complaints  Past medical history,surgical history, problem list, medications, allergies, family history and social history were all reviewed and documented as reviewed in the EPIC chart.  ROS:  Performed with pertinent positives and negatives included in the history, assessment and plan.   Additional significant findings :  none   Exam: [name] Counsellor Vitals:   11/11/14 1547  BP: 124/80  Height: 5\' 6"  (1.676 m)  Weight: 281 lb (127.461 kg)   General appearance:  Normal affect, orientation and appearance. Skin: Grossly normal HEENT: Without gross lesions.  No cervical or supraclavicular adenopathy. Thyroid normal.  Lungs:  Clear without wheezing, rales or rhonchi Cardiac: RR, without RMG Abdominal:  Soft, nontender, without masses, guarding, rebound, organomegaly or hernia Breasts:  Examined lying and sitting without masses, retractions, discharge or axillary adenopathy. Pelvic:  Ext/BUS/vagina normal  Adnexa  Without masses or tenderness    Anus and perineum  Normal   Rectovaginal  Normal sphincter tone without palpated masses or tenderness.    Assessment/Plan:  44 y.o. G32P0020 female for annual exam.   1. Status post TAH 2014 for leiomyoma. Doing well without symptoms to suggest menopause. Continue to monitor. 2. Mammography 10/2014. Continue with annual mammography. SBE monthly reviewed. 3. Pap smear 2015. No Pap smear done today. No history of significant abnormal Pap smears. Options to stop screening altogether she is status post hysterectomy for benign indications first less frequent screening intervals reviewed. Will readdress on an annual basis. 4. Health maintenance. No routine lab work done as patient reports this done at her primary physician's office. Follow up 1 year, sooner as needed.   [name] MD, 4:17 PM 11/11/2014

## 2014-11-18 ENCOUNTER — Encounter (HOSPITAL_COMMUNITY): Payer: Self-pay | Admitting: Emergency Medicine

## 2014-11-18 ENCOUNTER — Emergency Department (HOSPITAL_COMMUNITY)
Admission: EM | Admit: 2014-11-18 | Discharge: 2014-11-18 | Disposition: A | Discharge status: Home / Self Care | Payer: 59 | Attending: Emergency Medicine | Admitting: Emergency Medicine

## 2014-11-18 DIAGNOSIS — I10 Essential (primary) hypertension: Secondary | ICD-10-CM | POA: Insufficient documentation

## 2014-11-18 DIAGNOSIS — S3992XA Unspecified injury of lower back, initial encounter: Secondary | ICD-10-CM | POA: Diagnosis not present

## 2014-11-18 DIAGNOSIS — Y92410 Unspecified street and highway as the place of occurrence of the external cause: Secondary | ICD-10-CM | POA: Diagnosis not present

## 2014-11-18 DIAGNOSIS — Y998 Other external cause status: Secondary | ICD-10-CM | POA: Diagnosis not present

## 2014-11-18 DIAGNOSIS — Y9389 Activity, other specified: Secondary | ICD-10-CM | POA: Diagnosis not present

## 2014-11-18 DIAGNOSIS — M62838 Other muscle spasm: Secondary | ICD-10-CM | POA: Diagnosis not present

## 2014-11-18 DIAGNOSIS — S0990XA Unspecified injury of head, initial encounter: Secondary | ICD-10-CM | POA: Diagnosis not present

## 2014-11-18 DIAGNOSIS — R519 Headache, unspecified: Secondary | ICD-10-CM

## 2014-11-18 DIAGNOSIS — Z8669 Personal history of other diseases of the nervous system and sense organs: Secondary | ICD-10-CM | POA: Insufficient documentation

## 2014-11-18 DIAGNOSIS — R51 Headache: Secondary | ICD-10-CM

## 2014-11-18 DIAGNOSIS — S199XXA Unspecified injury of neck, initial encounter: Secondary | ICD-10-CM | POA: Insufficient documentation

## 2014-11-18 DIAGNOSIS — Z79899 Other long term (current) drug therapy: Secondary | ICD-10-CM | POA: Diagnosis not present

## 2014-11-18 MED ORDER — IBUPROFEN 800 MG PO TABS: 800.0000 mg | ORAL_TABLET | Freq: Four times a day (QID) | ORAL | Status: DC | PRN

## 2014-11-18 MED ORDER — CYCLOBENZAPRINE HCL 10 MG PO TABS: 10.0000 mg | ORAL_TABLET | Freq: Two times a day (BID) | ORAL | Status: DC | PRN

## 2014-11-18 NOTE — Discharge Instructions (Signed)
Take medications as prescribed.  Expect to be sore tomorrow, and have new areas of pain.  Warm soaks, heating pads will help with pain. Return to the ER for worsening pain that is not controlled with the medication, new weakness or numbness, or other concerns you may have.  Follow up with your doctor in 3-5 days for recheck.
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
## 2014-11-18 NOTE — ED Notes (Signed)
PA at the bedside.
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
## 2015-04-01 ENCOUNTER — Other Ambulatory Visit: Payer: Self-pay | Admitting: Gynecology

## 2015-04-01 DIAGNOSIS — N6320 Unspecified lump in the left breast, unspecified quadrant: Secondary | ICD-10-CM

## 2015-04-28 ENCOUNTER — Other Ambulatory Visit: Payer: 59

## 2015-04-28 ENCOUNTER — Ambulatory Visit
Admission: RE | Admit: 2015-04-28 | Discharge: 2015-04-28 | Disposition: A | Discharge status: Home / Self Care | Payer: 59 | Source: Ambulatory Visit | Attending: Gynecology | Admitting: Gynecology

## 2015-04-28 DIAGNOSIS — N6320 Unspecified lump in the left breast, unspecified quadrant: Secondary | ICD-10-CM

## 2015-05-29 ENCOUNTER — Ambulatory Visit
Admission: RE | Admit: 2015-05-29 | Discharge: 2015-05-29 | Disposition: A | Discharge status: Home / Self Care | Payer: 59 | Source: Ambulatory Visit | Attending: Family Medicine | Admitting: Family Medicine

## 2015-05-29 ENCOUNTER — Other Ambulatory Visit: Payer: Self-pay | Admitting: Family Medicine

## 2015-05-29 DIAGNOSIS — M549 Dorsalgia, unspecified: Secondary | ICD-10-CM

## 2015-09-22 ENCOUNTER — Emergency Department (HOSPITAL_COMMUNITY)
Admission: EM | Admit: 2015-09-22 | Discharge: 2015-09-23 | Disposition: A | Discharge status: Home / Self Care | Payer: 59 | Attending: Emergency Medicine | Admitting: Emergency Medicine

## 2015-09-22 ENCOUNTER — Emergency Department (HOSPITAL_COMMUNITY): Payer: 59

## 2015-09-22 ENCOUNTER — Encounter (HOSPITAL_COMMUNITY): Payer: Self-pay | Admitting: Emergency Medicine

## 2015-09-22 DIAGNOSIS — Z79899 Other long term (current) drug therapy: Secondary | ICD-10-CM | POA: Diagnosis not present

## 2015-09-22 DIAGNOSIS — I10 Essential (primary) hypertension: Secondary | ICD-10-CM | POA: Diagnosis not present

## 2015-09-22 DIAGNOSIS — J189 Pneumonia, unspecified organism: Secondary | ICD-10-CM | POA: Diagnosis not present

## 2015-09-22 DIAGNOSIS — R509 Fever, unspecified: Secondary | ICD-10-CM | POA: Diagnosis present

## 2015-09-22 LAB — COMPREHENSIVE METABOLIC PANEL
ALT: 17 U/L (ref 14–54)
AST: 25 U/L (ref 15–41)
Albumin: 4.2 g/dL (ref 3.5–5.0)
Alkaline Phosphatase: 45 U/L (ref 38–126)
Anion gap: 6 (ref 5–15)
BUN: 10 mg/dL (ref 6–20)
CO2: 25 mmol/L (ref 22–32)
Calcium: 9.3 mg/dL (ref 8.9–10.3)
Chloride: 104 mmol/L (ref 101–111)
Creatinine, Ser: 1.27 mg/dL — ABNORMAL HIGH (ref 0.44–1.00)
GFR calc Af Amer: 57 mL/min — ABNORMAL LOW (ref >60)
GFR calc non Af Amer: 49 mL/min — ABNORMAL LOW (ref >60)
Glucose, Bld: 108 mg/dL — ABNORMAL HIGH (ref 65–99)
Potassium: 3.3 mmol/L — ABNORMAL LOW (ref 3.5–5.1)
Sodium: 135 mmol/L (ref 135–145)
Total Bilirubin: 1.6 mg/dL — ABNORMAL HIGH (ref 0.3–1.2)
Total Protein: 8 g/dL (ref 6.5–8.1)

## 2015-09-22 LAB — URINALYSIS, ROUTINE W REFLEX MICROSCOPIC
Bilirubin Urine: NEGATIVE
Glucose, UA: NEGATIVE mg/dL
Ketones, ur: NEGATIVE mg/dL
Leukocytes, UA: NEGATIVE
Nitrite: NEGATIVE
Protein, ur: NEGATIVE mg/dL
Specific Gravity, Urine: 1.005 (ref 1.005–1.030)
pH: 6 (ref 5.0–8.0)

## 2015-09-22 LAB — I-STAT CG4 LACTIC ACID, ED: LACTIC ACID, VENOUS: 0.88 mmol/L (ref 0.5–1.9)

## 2015-09-22 LAB — CBC WITH DIFFERENTIAL/PLATELET
Basophils Absolute: 0 10*3/uL (ref 0.0–0.1)
Basophils Relative: 0 %
Eosinophils Absolute: 0 10*3/uL (ref 0.0–0.7)
Eosinophils Relative: 0 %
HCT: 39 % (ref 36.0–46.0)
Hemoglobin: 13.8 g/dL (ref 12.0–15.0)
Lymphocytes Relative: 9 %
Lymphs Abs: 1.6 10*3/uL (ref 0.7–4.0)
MCH: 30.2 pg (ref 26.0–34.0)
MCHC: 35.4 g/dL (ref 30.0–36.0)
MCV: 85.3 fL (ref 78.0–100.0)
Monocytes Absolute: 1.7 10*3/uL — ABNORMAL HIGH (ref 0.1–1.0)
Monocytes Relative: 10 %
Neutro Abs: 14.1 10*3/uL — ABNORMAL HIGH (ref 1.7–7.7)
Neutrophils Relative %: 81 %
Platelets: 181 10*3/uL (ref 150–400)
RBC: 4.57 MIL/uL (ref 3.87–5.11)
RDW: 13.3 % (ref 11.5–15.5)
WBC: 17.4 10*3/uL — ABNORMAL HIGH (ref 4.0–10.5)

## 2015-09-22 LAB — URINE MICROSCOPIC-ADD ON

## 2015-09-22 MED ORDER — ACETAMINOPHEN 325 MG PO TABS
650.0000 mg | ORAL_TABLET | Freq: Once | ORAL | Status: AC
  Administered 2015-09-22: 650 mg via ORAL
  Filled 2015-09-22: qty 2

## 2015-09-22 MED ORDER — AZITHROMYCIN 250 MG PO TABS
500.0000 mg | ORAL_TABLET | Freq: Once | ORAL | Status: AC
Start: 2015-09-23 — End: 2015-09-23
  Administered 2015-09-23: 500 mg via ORAL
  Filled 2015-09-22: qty 2

## 2015-09-22 MED ORDER — DEXTROSE 5 % IV SOLN
1.0000 g | Freq: Once | INTRAVENOUS | Status: AC
  Administered 2015-09-22: 1 g via INTRAVENOUS
  Filled 2015-09-22: qty 10

## 2015-09-22 MED ORDER — SODIUM CHLORIDE 0.9 % IV BOLUS (SEPSIS)
1000.0000 mL | Freq: Once | INTRAVENOUS | Status: AC
  Administered 2015-09-22: 1000 mL via INTRAVENOUS

## 2015-09-22 NOTE — ED Provider Notes (Signed)
[facility] DEPT Provider Note   CSN: [id number] Arrival date & time: 09/22/15  1938     History   Chief Complaint Chief Complaint  Patient presents with  . Fever    HPI Kristine Hayes is a 45 y.o. female.  HPI   45 year old female presents today with complaints of fever. Patient reports symptoms started yesterday with body aches, generalized headache, fever. She reports her headaches feels like pressure, denies any focal neurological deficits. Patient denies any neck stiffness, cough, shortness of breath, upper respiratory congestion, sore throat, rash, abdominal pain, nausea or vomiting, diarrhea, exposure to abnormal food or drink. She denies any outdoor exposure, insect bites. No history of the same. No other complaints other than the fever, body aches, headache. Patient reports she is otherwise healthy, taking Plaquenil  for lupus.     Past Medical History:  Diagnosis Date  . Hypertension    BORDERLINE  . Lupus (HCC)    DX-SKIN BIOPSY-10  . Shortness of breath    on exertion due to wt gain per pt  . Sleep apnea    mild-no CPAP    Patient Active Problem List   Diagnosis Date Noted  . ERYTHEMA NODOSUM 12/15/2008  . PAIN IN JOINT, MULTIPLE SITES 12/15/2008  . ULCERATIVE PROCTITIS 07/13/2007  . NAUSEA 07/13/2007  . CONSTIPATION, CHRONIC 07/12/2007    Past Surgical History:  Procedure Laterality Date  . ABDOMINAL HYSTERECTOMY N/A 07/09/2012   Procedure: HYSTERECTOMY ABDOMINAL;  Surgeon: [name], MD;  Location: [facility];  Service: Gynecology;  Laterality: N/A;  . ABDOMINAL SURGERY  07/2002   ABDOMINAL MYOMECTOMY    OB History    Gravida Para Term Preterm AB Living   2       2 0   SAB TAB Ectopic Multiple Live Births   2               Home Medications    Prior to Admission medications   Medication Sig Start Date End Date Taking? Authorizing Provider  acetaminophen (TYLENOL) 500 MG tablet Take 1,000 mg by mouth every 6 (six) hours as needed for  mild pain.   Yes Historical Provider, MD  Biotin 5000 MCG TABS Take 1 tablet by mouth daily.   Yes Historical Provider, MD  Calcium Citrate (CITRACAL PO) Take 1 tablet by mouth daily.    Yes Historical Provider, MD  Cholecalciferol (VITAMIN D PO) Take 5,000 Units by mouth daily.    Yes Historical Provider, MD  fish oil-omega-3 fatty acids 1000 MG capsule Take 1 g by mouth daily.    Yes Historical Provider, MD  hydroxychloroquine (PLAQUENIL) 200 MG tablet Take 200 mg by mouth daily.    Yes Historical Provider, MD  losartan-hydrochlorothiazide (HYZAAR) 100-12.5 MG tablet Take 1 tablet by mouth daily.   Yes Historical Provider, MD  Multiple Vitamin (MULTIVITAMIN) tablet Take 1 tablet by mouth daily.   Yes Historical Provider, MD  cyclobenzaprine (FLEXERIL) 10 MG tablet Take 1 tablet (10 mg total) by mouth 2 (two) times daily as needed for muscle spasms. Patient not taking: Reported on 09/22/2015 11/18/14   [facility] Ward, PA-C  ibuprofen (ADVIL,MOTRIN) 800 MG tablet Take 1 tablet (800 mg total) by mouth every 6 (six) hours as needed for headache or moderate pain. Patient not taking: Reported on 09/22/2015 11/18/14   [facility] Ward, PA-C  levofloxacin (LEVAQUIN) 750 MG tablet Take 1 tablet (750 mg total) by mouth daily. 09/23/15   Okey Regal, PA-C    Family  History Family History  Problem Relation Age of Onset  . Breast cancer Maternal Aunt     Age unknown    Social History Social History  Substance Use Topics  . Smoking status: Never Smoker  . Smokeless tobacco: Never Used  . Alcohol use No     Allergies   Celecoxib   Review of Systems Review of Systems  All other systems reviewed and are negative.    Physical Exam Updated Vital Signs BP 144/80 (BP Location: Right Arm)   Pulse 94   Temp 98.6 F (37 C) (Oral)   Resp 18   Ht 5\' 6"  (1.676 m)   Wt 127 kg   LMP 04/20/2012   SpO2 100%   BMI 45.19 kg/m   Physical Exam  Constitutional: She is oriented to person,  place, and time. She appears well-developed and well-nourished.  HENT:  Head: Normocephalic and atraumatic.  Eyes: Conjunctivae are normal. Pupils are equal, round, and reactive to light. Right eye exhibits no discharge. Left eye exhibits no discharge. No scleral icterus.  Neck: Normal range of motion. No JVD present. No tracheal deviation present.  Cardiovascular: Regular rhythm, normal heart sounds and intact distal pulses.   Pulmonary/Chest: Effort normal and breath sounds normal. No stridor. No respiratory distress. She has no wheezes. She has no rales. She exhibits no tenderness.  Musculoskeletal: She exhibits no edema.  Neurological: She is alert and oriented to person, place, and time. Coordination normal.  Psychiatric: She has a normal mood and affect. Her behavior is normal. Judgment and thought content normal.  Nursing note and vitals reviewed.   ED Treatments / Results  Labs (all labs ordered are listed, but only abnormal results are displayed) Labs Reviewed  CBC WITH DIFFERENTIAL/PLATELET - Abnormal; Notable for the following:       Result Value   WBC 17.4 (*)    Neutro Abs 14.1 (*)    Monocytes Absolute 1.7 (*)    All other components within normal limits  COMPREHENSIVE METABOLIC PANEL - Abnormal; Notable for the following:    Potassium 3.3 (*)    Glucose, Bld 108 (*)    Creatinine, Ser 1.27 (*)    Total Bilirubin 1.6 (*)    GFR calc non Af Amer 49 (*)    GFR calc Af Amer 57 (*)    All other components within normal limits  URINALYSIS, ROUTINE W REFLEX MICROSCOPIC (NOT AT [facility]) - Abnormal; Notable for the following:    Hgb urine dipstick MODERATE (*)    All other components within normal limits  URINE MICROSCOPIC-ADD ON - Abnormal; Notable for the following:    Squamous Epithelial / LPF 0-5 (*)    Bacteria, UA FEW (*)    All other components within normal limits  I-STAT CG4 LACTIC ACID, ED    EKG  EKG Interpretation None       Radiology Dg Chest 2  View  Result Date: 09/22/2015 CLINICAL DATA:  Fever, body aches and chills for 1 day. History of lupus. EXAM: CHEST  2 VIEW COMPARISON:  None. FINDINGS: Patchy RIGHT lung base airspace opacity without pleural effusion. Cardiomediastinal silhouette is normal. No pneumothorax. Hair artifact at the neck. Osseous structures are nonsuspicious. IMPRESSION: RIGHT lung base airspace opacity concerning for pneumonia. Followup PA and lateral chest X-ray is recommended in 3-4 weeks following trial of antibiotic therapy to ensure resolution and exclude underlying malignancy. Electronically Signed   By: Elon Alas M.D.   On: 09/22/2015 22:41  Procedures Procedures (including critical care time)  Medications Ordered in ED Medications  acetaminophen (TYLENOL) tablet 650 mg (650 mg Oral Given 09/22/15 2034)  sodium chloride 0.9 % bolus 1,000 mL (0 mLs Intravenous Stopped 09/23/15 0055)  cefTRIAXone (ROCEPHIN) 1 g in dextrose 5 % 50 mL IVPB (0 g Intravenous Stopped 09/23/15 0020)  azithromycin (ZITHROMAX) tablet 500 mg (500 mg Oral Given 09/23/15 0010)     Initial Impression / Assessment and Plan / ED Course  I have reviewed the triage vital signs and the nursing notes.  Pertinent labs & imaging results that were available during my care of the patient were reviewed by me and considered in my medical decision making (see chart for details).  Clinical Course     Final Clinical Impressions(s) / ED Diagnoses   Final diagnoses:  Community acquired pneumonia    Labs:  Imaging:  Consults:  Therapeutics:  Discharge Meds:   Assessment/Plan:   Patient's presentation is most consistent with pneumonia.: Initial evaluation patient expressed no pulmonary complaints, chest x-ray returned with what appears to be pneumonia. Patient is experiencing headache along with the fever. Presentation most consistent with pneumonia, low suspicion with x-ray findings for meningitis in this patient. Patient given  dose of antibiotics here in the ED, discharged home on oral antibiotics, encouraged follow-up with her primary care provider in 2 days for reevaluation. She is instructed to return to emergency room immediately if she experiences any new or worsening signs or symptoms. Patient is well-appearing, nontoxic, with a temperature of 98.6. She has no signs of sepsis, or significant comorbidities that would require inpatient management.     New Prescriptions New Prescriptions   LEVOFLOXACIN (LEVAQUIN) 750 MG TABLET    Take 1 tablet (750 mg total) by mouth daily.     Okey Regal, PA-C 09/23/15 0105    Davonna Belling, MD 09/23/15 [phone]

## 2015-09-22 NOTE — ED Triage Notes (Signed)
Pt c/o fever, body aches, chills, fatigue, HA onset yesterday. Denies sore throat, denies v/d, denies dysuria.

## 2015-09-22 NOTE — ED Notes (Signed)
Consent received for lumbar puncture.  PA at bedside.

## 2015-09-22 NOTE — ED Notes (Signed)
EDP at bedside for LP, delayed in blood work

## 2015-09-22 NOTE — ED Notes (Signed)
Patient transported to X-ray 

## 2015-09-23 MED ORDER — LEVOFLOXACIN 750 MG PO TABS: 750.0000 mg | ORAL_TABLET | Freq: Every day | ORAL | 0 refills | Status: DC

## 2015-09-23 NOTE — ED Notes (Signed)
PA at bedside.

## 2015-09-23 NOTE — ED Notes (Signed)
Pt ambulatory and independent at discharge.  Verbalized understanding of discharge instructions 

## 2015-09-23 NOTE — Discharge Instructions (Signed)
Please read attached information. If you experience any new or worsening signs or symptoms please return to the emergency room for evaluation. Please follow-up with your primary care provider or specialist as discussed. Please use medication prescribed only as directed and discontinue taking if you have any concerning signs or symptoms.   °

## 2015-10-15 ENCOUNTER — Other Ambulatory Visit: Payer: Self-pay | Admitting: Gynecology

## 2015-10-15 DIAGNOSIS — Z1231 Encounter for screening mammogram for malignant neoplasm of breast: Secondary | ICD-10-CM

## 2015-10-16 ENCOUNTER — Ambulatory Visit
Admission: RE | Admit: 2015-10-16 | Discharge: 2015-10-16 | Disposition: A | Discharge status: Home / Self Care | Payer: 59 | Source: Ambulatory Visit | Attending: Family Medicine | Admitting: Family Medicine

## 2015-10-16 ENCOUNTER — Other Ambulatory Visit: Payer: Self-pay | Admitting: Family Medicine

## 2015-10-16 DIAGNOSIS — J189 Pneumonia, unspecified organism: Secondary | ICD-10-CM

## 2015-10-21 ENCOUNTER — Ambulatory Visit
Admission: RE | Admit: 2015-10-21 | Discharge: 2015-10-21 | Disposition: A | Discharge status: Home / Self Care | Payer: 59 | Source: Ambulatory Visit | Attending: Gynecology | Admitting: Gynecology

## 2015-10-21 DIAGNOSIS — Z1231 Encounter for screening mammogram for malignant neoplasm of breast: Secondary | ICD-10-CM

## 2015-11-12 ENCOUNTER — Ambulatory Visit (INDEPENDENT_AMBULATORY_CARE_PROVIDER_SITE_OTHER): Payer: 59 | Admitting: Gynecology

## 2015-11-12 ENCOUNTER — Encounter: Payer: Self-pay | Admitting: Gynecology

## 2015-11-12 VITALS — BP 114/74 | Ht 67.0 in | Wt 292.0 lb

## 2015-11-12 DIAGNOSIS — N760 Acute vaginitis: Secondary | ICD-10-CM | POA: Diagnosis not present

## 2015-11-12 DIAGNOSIS — Z01411 Encounter for gynecological examination (general) (routine) with abnormal findings: Secondary | ICD-10-CM | POA: Diagnosis not present

## 2015-11-12 LAB — WET PREP FOR TRICH, YEAST, CLUE: Trich, Wet Prep: NONE SEEN

## 2015-11-12 NOTE — Progress Notes (Signed)
    Kristine Hayes July 13, 1970 [id number]        45 y.o.  G2P0020  for annual exam.  Several issues noted below.  Past medical history,surgical history, problem list, medications, allergies, family history and social history were all reviewed and documented as reviewed in the EPIC chart.  ROS:  Performed with pertinent positives and negatives included in the history, assessment and plan.   Additional significant findings :  None   Exam: [name] assistant Vitals:   11/12/15 1538  BP: 114/74  Weight: 292 lb (132.5 kg)  Height: 5\' 7"  (1.702 m)   Body mass index is 45.73 kg/m.  General appearance:  Normal affect, orientation and appearance. Skin: Grossly normal HEENT: Without gross lesions.  No cervical or supraclavicular adenopathy. Thyroid normal.  Lungs:  Clear without wheezing, rales or rhonchi Cardiac: RR, without RMG Abdominal:  Soft, nontender, without masses, guarding, rebound, organomegaly or hernia Breasts:  Examined lying and sitting without masses, retractions, discharge or axillary adenopathy. Pelvic:  Ext, BUS, Vagina with thick white discharge  Adnexa without masses or tenderness    Anus and perineum normal   Rectovaginal normal sphincter tone without palpated masses or tenderness.    Assessment/Plan:  45 y.o. G17P0020 female for annual exam. Status post TAH 2014 for leiomyoma.  1. Vaginal irritation. Patient notes being treated for pneumonia with antibiotics about a month ago. Since then has had vulvar itching and irritation since. No significant discharge historically but does have a thick white discharge on exam. No urinary tract symptoms such as dysuria urgency frequency. No vaginal odor.  Wet prep consistent with yeast. Will treat with Diflucan 150 mg 1 dose then repeat in several days after for a total of 2 doses. Follow up if symptoms persist, worsen or recur. 2. Mammography 10/2015. Continue with annual mammography when due. SBE monthly reviewed. 3. Pap  smear 2015. No Pap smear done today. No history of significant abnormal Pap smears. Options to stop screening altogether per current screening guidelines based on hysterectomy history reviewed versus less frequent screening intervals. Will readdress on annual basis. 4. Health maintenance. No routine lab work done as patient reports is done through her primary physician's office. Follow up in one year, sooner as needed.  Additional time in excess of her routine gynecologic exam was spent in direct face to face counseling and coordination of care in regards to her vaginitis.    [name] MD, 4:04 PM 11/12/2015

## 2015-11-12 NOTE — Addendum Note (Signed)
Addended by: Nelva Nay on: 11/12/2015 04:47 PM   Modules accepted: Orders

## 2015-11-12 NOTE — Patient Instructions (Signed)
Take that one Diflucan pill and then wait several days and take the second pill. Follow up if the irritation continues.  You may obtain a copy of any labs that were done today by logging onto MyChart as outlined in the instructions provided with your AVS (after visit summary). The office will not call with normal lab results but certainly if there are any significant abnormalities then we will contact you.   Health Maintenance Adopting a healthy lifestyle and getting preventive care can go a long way to promote health and wellness. Talk with your health care provider about what schedule of regular examinations is right for you. This is a good chance for you to check in with your provider about disease prevention and staying healthy. In between checkups, there are plenty of things you can do on your own. Experts have done a lot of research about which lifestyle changes and preventive measures are most likely to keep you healthy. Ask your health care provider for more information. WEIGHT AND DIET  Eat a healthy diet  Be sure to include plenty of vegetables, fruits, low-fat dairy products, and lean protein.  Do not eat a lot of foods high in solid fats, added sugars, or salt.  Get regular exercise. This is one of the most important things you can do for your health.  Most adults should exercise for at least 150 minutes each week. The exercise should increase your heart rate and make you sweat (moderate-intensity exercise).  Most adults should also do strengthening exercises at least twice a week. This is in addition to the moderate-intensity exercise.  Maintain a healthy weight  Body mass index (BMI) is a measurement that can be used to identify possible weight problems. It estimates body fat based on height and weight. Your health care provider can help determine your BMI and help you achieve or maintain a healthy weight.  For females 79 years of age and older:   A BMI below 18.5 is  considered underweight.  A BMI of 18.5 to 24.9 is normal.  A BMI of 25 to 29.9 is considered overweight.  A BMI of 30 and above is considered obese.  Watch levels of cholesterol and blood lipids  You should start having your blood tested for lipids and cholesterol at 47 years of age, then have this test every 5 years.  You may need to have your cholesterol levels checked more often if:  Your lipid or cholesterol levels are high.  You are older than 47 years of age.  You are at high risk for heart disease.  CANCER SCREENING   Lung Cancer  Lung cancer screening is recommended for adults 53-7 years old who are at high risk for lung cancer because of a history of smoking.  A yearly low-dose CT scan of the lungs is recommended for people who:  Currently smoke.  Have quit within the past 15 years.  Have at least a 30-pack-year history of smoking. A pack year is smoking an average of one pack of cigarettes a day for 1 year.  Yearly screening should continue until it has been 15 years since you quit.  Yearly screening should stop if you develop a health problem that would prevent you from having lung cancer treatment.  Breast Cancer  Practice breast self-awareness. This means understanding how your breasts normally appear and feel.  It also means doing regular breast self-exams. Let your health care provider know about any changes, no matter how small.  If  you are in your 20s or 30s, you should have a clinical breast exam (CBE) by a health care provider every 1-3 years as part of a regular health exam.  If you are 5 or older, have a CBE every year. Also consider having a breast X-ray (mammogram) every year.  If you have a family history of breast cancer, talk to your health care provider about genetic screening.  If you are at high risk for breast cancer, talk to your health care provider about having an MRI and a mammogram every year.  Breast cancer gene (BRCA)  assessment is recommended for women who have family members with BRCA-related cancers. BRCA-related cancers include:  Breast.  Ovarian.  Tubal.  Peritoneal cancers.  Results of the assessment will determine the need for genetic counseling and BRCA1 and BRCA2 testing. Cervical Cancer Routine pelvic examinations to screen for cervical cancer are no longer recommended for nonpregnant women who are considered low risk for cancer of the pelvic organs (ovaries, uterus, and vagina) and who do not have symptoms. A pelvic examination may be necessary if you have symptoms including those associated with pelvic infections. Ask your health care provider if a screening pelvic exam is right for you.   The Pap test is the screening test for cervical cancer for women who are considered at risk.  If you had a hysterectomy for a problem that was not cancer or a condition that could lead to cancer, then you no longer need Pap tests.  If you are older than 65 years, and you have had normal Pap tests for the past 10 years, you no longer need to have Pap tests.  If you have had past treatment for cervical cancer or a condition that could lead to cancer, you need Pap tests and screening for cancer for at least 20 years after your treatment.  If you no longer get a Pap test, assess your risk factors if they change (such as having a new sexual partner). This can affect whether you should start being screened again.  Some women have medical problems that increase their chance of getting cervical cancer. If this is the case for you, your health care provider may recommend more frequent screening and Pap tests.  The human papillomavirus (HPV) test is another test that may be used for cervical cancer screening. The HPV test looks for the virus that can cause cell changes in the cervix. The cells collected during the Pap test can be tested for HPV.  The HPV test can be used to screen women 72 years of age and older.  Getting tested for HPV can extend the interval between normal Pap tests from three to five years.  An HPV test also should be used to screen women of any age who have unclear Pap test results.  After 47 years of age, women should have HPV testing as often as Pap tests.  Colorectal Cancer  This type of cancer can be detected and often prevented.  Routine colorectal cancer screening usually begins at 47 years of age and continues through 47 years of age.  Your health care provider may recommend screening at an earlier age if you have risk factors for colon cancer.  Your health care provider may also recommend using home test kits to check for hidden blood in the stool.  A small camera at the end of a tube can be used to examine your colon directly (sigmoidoscopy or colonoscopy). This is done to check for the  earliest forms of colorectal cancer.  Routine screening usually begins at age 8.  Direct examination of the colon should be repeated every 5-10 years through 47 years of age. However, you may need to be screened more often if early forms of precancerous polyps or small growths are found. Skin Cancer  Check your skin from head to toe regularly.  Tell your health care provider about any new moles or changes in moles, especially if there is a change in a mole's shape or color.  Also tell your health care provider if you have a mole that is larger than the size of a pencil eraser.  Always use sunscreen. Apply sunscreen liberally and repeatedly throughout the day.  Protect yourself by wearing long sleeves, pants, a wide-brimmed hat, and sunglasses whenever you are outside. HEART DISEASE, DIABETES, AND HIGH BLOOD PRESSURE   Have your blood pressure checked at least every 1-2 years. High blood pressure causes heart disease and increases the risk of stroke.  If you are between 49 years and 42 years old, ask your health care provider if you should take aspirin to prevent  strokes.  Have regular diabetes screenings. This involves taking a blood sample to check your fasting blood sugar level.  If you are at a normal weight and have a low risk for diabetes, have this test once every three years after 47 years of age.  If you are overweight and have a high risk for diabetes, consider being tested at a younger age or more often. PREVENTING INFECTION  Hepatitis B  If you have a higher risk for hepatitis B, you should be screened for this virus. You are considered at high risk for hepatitis B if:  You were born in a country where hepatitis B is common. Ask your health care provider which countries are considered high risk.  Your parents were born in a high-risk country, and you have not been immunized against hepatitis B (hepatitis B vaccine).  You have HIV or AIDS.  You use needles to inject street drugs.  You live with someone who has hepatitis B.  You have had sex with someone who has hepatitis B.  You get hemodialysis treatment.  You take certain medicines for conditions, including cancer, organ transplantation, and autoimmune conditions. Hepatitis C  Blood testing is recommended for:  Everyone born from [birth year] through 1967.  Anyone with known risk factors for hepatitis C. Sexually transmitted infections (STIs)  You should be screened for sexually transmitted infections (STIs) including gonorrhea and chlamydia if:  You are sexually active and are younger than 47 years of age.  You are older than 47 years of age and your health care provider tells you that you are at risk for this type of infection.  Your sexual activity has changed since you were last screened and you are at an increased risk for chlamydia or gonorrhea. Ask your health care provider if you are at risk.  If you do not have HIV, but are at risk, it may be recommended that you take a prescription medicine daily to prevent HIV infection. This is called pre-exposure prophylaxis  (PrEP). You are considered at risk if:  You are sexually active and do not regularly use condoms or know the HIV status of your partner(s).  You take drugs by injection.  You are sexually active with a partner who has HIV. Talk with your health care provider about whether you are at high risk of being infected with HIV. If you choose  to begin PrEP, you should first be tested for HIV. You should then be tested every 3 months for as long as you are taking PrEP.  PREGNANCY   If you are premenopausal and you may become pregnant, ask your health care provider about preconception counseling.  If you may become pregnant, take 400 to 800 micrograms (mcg) of folic acid every day.  If you want to prevent pregnancy, talk to your health care provider about birth control (contraception). OSTEOPOROSIS AND MENOPAUSE   Osteoporosis is a disease in which the bones lose minerals and strength with aging. This can result in serious bone fractures. Your risk for osteoporosis can be identified using a bone density scan.  If you are 25 years of age or older, or if you are at risk for osteoporosis and fractures, ask your health care provider if you should be screened.  Ask your health care provider whether you should take a calcium or vitamin D supplement to lower your risk for osteoporosis.  Menopause may have certain physical symptoms and risks.  Hormone replacement therapy may reduce some of these symptoms and risks. Talk to your health care provider about whether hormone replacement therapy is right for you.  HOME CARE INSTRUCTIONS   Schedule regular health, dental, and eye exams.  Stay current with your immunizations.   Do not use any tobacco products including cigarettes, chewing tobacco, or electronic cigarettes.  If you are pregnant, do not drink alcohol.  If you are breastfeeding, limit how much and how often you drink alcohol.  Limit alcohol intake to no more than 1 drink per day for  nonpregnant women. One drink equals 12 ounces of beer, 5 ounces of wine, or 1 ounces of hard liquor.  Do not use street drugs.  Do not share needles.  Ask your health care provider for help if you need support or information about quitting drugs.  Tell your health care provider if you often feel depressed.  Tell your health care provider if you have ever been abused or do not feel safe at home. Document Released: 07/19/2010 Document Revised: 05/20/2013 Document Reviewed: 12/05/2012 [facility] Patient Information 2015 [place], [place]. This information is not intended to replace advice given to you by your health care provider. Make sure you discuss any questions you have with your health care provider.

## 2015-11-13 ENCOUNTER — Ambulatory Visit
Admission: RE | Admit: 2015-11-13 | Discharge: 2015-11-13 | Disposition: A | Discharge status: Home / Self Care | Payer: 59 | Source: Ambulatory Visit | Attending: Family Medicine | Admitting: Family Medicine

## 2015-11-13 ENCOUNTER — Other Ambulatory Visit: Payer: Self-pay | Admitting: Family Medicine

## 2015-11-13 DIAGNOSIS — J189 Pneumonia, unspecified organism: Secondary | ICD-10-CM

## 2015-11-18 ENCOUNTER — Telehealth: Payer: Self-pay | Admitting: *Deleted

## 2015-11-18 MED ORDER — FLUCONAZOLE 150 MG PO TABS: ORAL_TABLET | ORAL | 0 refills | Status: DC

## 2015-11-18 NOTE — Telephone Encounter (Signed)
Pt was seen on 11/12/15 at Rx Diflucan 150 mg 1 dose then repeat in several days after for a total of 2 doses was never sent to pharmacy. Pt aware,

## 2015-12-23 ENCOUNTER — Other Ambulatory Visit: Payer: Self-pay | Admitting: Gastroenterology

## 2015-12-25 ENCOUNTER — Encounter (HOSPITAL_COMMUNITY): Payer: Self-pay

## 2015-12-27 NOTE — Anesthesia Preprocedure Evaluation (Addendum)
Anesthesia Evaluation  Patient identified by MRN, date of birth, ID band Patient awake    Reviewed: Allergy & Precautions, NPO status , Patient's Chart, lab work & pertinent test results  Airway Mallampati: III  TM Distance: >3 FB Neck ROM: Full    Dental  (+) Dental Advisory Given   Pulmonary sleep apnea ,    breath sounds clear to auscultation       Cardiovascular hypertension, Pt. on medications  Rhythm:Regular Rate:Normal     Neuro/Psych negative neurological ROS     GI/Hepatic Neg liver ROS, PUD,   Endo/Other  Morbid obesity  Renal/GU negative Renal ROS     Musculoskeletal   Abdominal   Peds  Hematology negative hematology ROS (+)   Anesthesia Other Findings   Reproductive/Obstetrics                            Anesthesia Physical Anesthesia Plan  ASA: III  Anesthesia Plan: MAC   Post-op Pain Management:    Induction: Intravenous  Airway Management Planned: Natural Airway and Nasal Cannula  Additional Equipment:   Intra-op Plan:   Post-operative Plan:   Informed Consent: I have reviewed the patients History and Physical, chart, labs and discussed the procedure including the risks, benefits and alternatives for the proposed anesthesia with the patient or authorized representative who has indicated his/her understanding and acceptance.     Plan Discussed with:   Anesthesia Plan Comments:        Anesthesia Quick Evaluation

## 2015-12-28 ENCOUNTER — Ambulatory Visit (HOSPITAL_COMMUNITY): Payer: 59 | Admitting: Anesthesiology

## 2015-12-28 ENCOUNTER — Encounter (HOSPITAL_COMMUNITY)
Admission: RE | Disposition: A | Discharge status: Home / Self Care | Payer: Self-pay | Source: Ambulatory Visit | Attending: Gastroenterology

## 2015-12-28 ENCOUNTER — Ambulatory Visit (HOSPITAL_COMMUNITY)
Admission: RE | Admit: 2015-12-28 | Discharge: 2015-12-28 | Disposition: A | Discharge status: Home / Self Care | Payer: 59 | Source: Ambulatory Visit | Attending: Gastroenterology | Admitting: Gastroenterology

## 2015-12-28 ENCOUNTER — Encounter (HOSPITAL_COMMUNITY): Payer: Self-pay

## 2015-12-28 DIAGNOSIS — M329 Systemic lupus erythematosus, unspecified: Secondary | ICD-10-CM | POA: Diagnosis not present

## 2015-12-28 DIAGNOSIS — Z7952 Long term (current) use of systemic steroids: Secondary | ICD-10-CM | POA: Insufficient documentation

## 2015-12-28 DIAGNOSIS — K51311 Ulcerative (chronic) rectosigmoiditis with rectal bleeding: Secondary | ICD-10-CM | POA: Insufficient documentation

## 2015-12-28 DIAGNOSIS — Z79899 Other long term (current) drug therapy: Secondary | ICD-10-CM | POA: Insufficient documentation

## 2015-12-28 DIAGNOSIS — Z6841 Body Mass Index (BMI) 40.0 and over, adult: Secondary | ICD-10-CM | POA: Diagnosis not present

## 2015-12-28 DIAGNOSIS — I10 Essential (primary) hypertension: Secondary | ICD-10-CM | POA: Diagnosis not present

## 2015-12-28 DIAGNOSIS — L52 Erythema nodosum: Secondary | ICD-10-CM | POA: Insufficient documentation

## 2015-12-28 DIAGNOSIS — R110 Nausea: Secondary | ICD-10-CM | POA: Insufficient documentation

## 2015-12-28 HISTORY — PX: FLEXIBLE SIGMOIDOSCOPY: SHX5431

## 2015-12-28 HISTORY — PX: ESOPHAGOGASTRODUODENOSCOPY (EGD) WITH PROPOFOL: SHX5813

## 2015-12-28 SURGERY — SIGMOIDOSCOPY, FLEXIBLE
Anesthesia: Monitor Anesthesia Care

## 2015-12-28 MED ORDER — LACTATED RINGERS IV SOLN
INTRAVENOUS | Status: DC | PRN
  Administered 2015-12-28: 07:00:00 via INTRAVENOUS

## 2015-12-28 MED ORDER — LACTATED RINGERS IV SOLN
INTRAVENOUS | Status: DC
  Administered 2015-12-28: 1000 mL via INTRAVENOUS

## 2015-12-28 MED ORDER — PROPOFOL 10 MG/ML IV BOLUS
INTRAVENOUS | Status: DC | PRN
  Administered 2015-12-28: 10 mg via INTRAVENOUS
  Administered 2015-12-28: 40 mg via INTRAVENOUS
  Administered 2015-12-28: 10 mg via INTRAVENOUS
  Administered 2015-12-28: 20 mg via INTRAVENOUS
  Administered 2015-12-28: 40 mg via INTRAVENOUS
  Administered 2015-12-28: 20 mg via INTRAVENOUS
  Administered 2015-12-28: 40 mg via INTRAVENOUS
  Administered 2015-12-28 (×2): 20 mg via INTRAVENOUS

## 2015-12-28 MED ORDER — ONDANSETRON HCL 4 MG/2ML IJ SOLN
INTRAMUSCULAR | Status: DC | PRN
  Administered 2015-12-28: 4 mg via INTRAVENOUS

## 2015-12-28 MED ORDER — PROPOFOL 10 MG/ML IV BOLUS
INTRAVENOUS | Status: AC
  Filled 2015-12-28: qty 40

## 2015-12-28 MED ORDER — SODIUM CHLORIDE 0.9 % IV SOLN: INTRAVENOUS | Status: DC

## 2015-12-28 MED ORDER — FLEET ENEMA 7-19 GM/118ML RE ENEM
ENEMA | RECTAL | Status: AC
  Filled 2015-12-28: qty 1

## 2015-12-28 MED ORDER — LIDOCAINE HCL (CARDIAC) 20 MG/ML IV SOLN
INTRAVENOUS | Status: DC | PRN
  Administered 2015-12-28: 75 mg via INTRAVENOUS

## 2015-12-28 MED ORDER — FLEET ENEMA 7-19 GM/118ML RE ENEM
1.0000 | ENEMA | Freq: Once | RECTAL | Status: AC
  Administered 2015-12-28: 1 via RECTAL

## 2015-12-28 SURGICAL SUPPLY — 14 items

## 2015-12-28 NOTE — H&P (Signed)
Problems: Nausea without vomiting, bloody diarrhea. Ulcerative proctosigmoiditis diagnosed in 2011. 11/15/2012 flexible proctosigmoidoscopy showed ulcerative proctosigmoiditis. 12/21/2009 diagnostic colonoscopy showed ulcerative proctosigmoiditis.  History: The patient is a 46 year old female born 1970-11-24. Despite taking prednisone 40 mg each morning to treat ulcerative proctosigmoiditis, the patient continues to experience bloody diarrhea and nausea without vomiting. Over the weekend, her nausea symptoms improved since starting omeprazole 20 mg before breakfast each morning.  She is scheduled to undergo diagnostic esophagogastroduodenoscopy to rule out peptic ulcer disease followed by diagnostic flexible proctosigmoidoscopy.  Past medical history: Hysterectomy. Hypertension. Osteoarthritis. Systemic lupus erythematosus. Erythema nodosum.  Medication allergies: Celebrex caused throat tightness.  Exam: The patient is alert and lying comfortably on the endoscopy stretcher. Abdomen is soft and nontender to palpation. Lungs are clear to auscultation. Cardiac exam reveals a regular rhythm.  Plan: Proceed with diagnostic esophagogastroduodenoscopy followed by diagnostic flexible proctosigmoidoscopy.

## 2015-12-28 NOTE — Transfer of Care (Signed)
Immediate Anesthesia Transfer of Care Note  Patient: Kristine Hayes  Procedure(s) Performed: Procedure(s): FLEXIBLE SIGMOIDOSCOPY (N/A) ESOPHAGOGASTRODUODENOSCOPY (EGD) WITH PROPOFOL (N/A)  Patient Location: PACU and Endoscopy Unit  Anesthesia Type:MAC  Level of Consciousness: awake, oriented, patient cooperative, lethargic and responds to stimulation  Airway & Oxygen Therapy: Patient Spontanous Breathing and Patient connected to nasal cannula oxygen  Post-op Assessment: Report given to RN, Post -op Vital signs reviewed and stable and Patient moving all extremities  Post vital signs: Reviewed and stable  Last Vitals:  Vitals:   12/28/15 0635 12/28/15 0803  BP: (!) 158/92   Pulse: 81 81  Resp: 12 14  Temp: 36.9 C 36.6 C    Last Pain:  Vitals:   12/28/15 0803  TempSrc: Oral         Complications: No apparent anesthesia complications

## 2015-12-28 NOTE — Discharge Instructions (Signed)
Colonoscopy, Adult, Care After  This sheet gives you information about how to care for yourself after your procedure. Your health care provider may also give you more specific instructions. If you have problems or questions, contact your health care provider.  What can I expect after the procedure?  After the procedure, it is common to have:  · A small amount of blood in your stool for 24 hours after the procedure.  · Some gas.  · Mild abdominal cramping or bloating.    Follow these instructions at home:  General instructions     · For the first 24 hours after the procedure:  ? Do not drive or use machinery.  ? Do not sign important documents.  ? Do not drink alcohol.  ? Do your regular daily activities at a slower pace than normal.  ? Eat soft, easy-to-digest foods.  ? Rest often.  · Take over-the-counter or prescription medicines only as told by your health care provider.  · It is up to you to get the results of your procedure. Ask your health care provider, or the department performing the procedure, when your results will be ready.  Relieving cramping and bloating   · Try walking around when you have cramps or feel bloated.  · Apply heat to your abdomen as told by your health care provider. Use a heat source that your health care provider recommends, such as a moist heat pack or a heating pad.  ? Place a towel between your skin and the heat source.  ? Leave the heat on for 20-30 minutes.  ? Remove the heat if your skin turns bright red. This is especially important if you are unable to feel pain, heat, or cold. You may have a greater risk of getting burned.  Eating and drinking   · Drink enough fluid to keep your urine clear or pale yellow.  · Resume your normal diet as instructed by your health care provider. Avoid heavy or fried foods that are hard to digest.  · Avoid drinking alcohol for as long as instructed by your health care provider.  Contact a health care provider if:  · You have blood in your stool 2-3  days after the procedure.  Get help right away if:  · You have more than a small spotting of blood in your stool.  · You pass large blood clots in your stool.  · Your abdomen is swollen.  · You have nausea or vomiting.  · You have a fever.  · You have increasing abdominal pain that is not relieved with medicine.  This information is not intended to replace advice given to you by your health care provider. Make sure you discuss any questions you have with your health care provider.  Document Released: 08/18/2003 Document Revised: 09/28/2015 Document Reviewed: 03/17/2015  Elsevier Interactive Patient Education © 2017 [employer]

## 2015-12-28 NOTE — Op Note (Signed)
[facility] Patient Name: Kristine Hayes Procedure Date: 12/28/2015 MRN: [id number] Attending MD: Garlan Fair , MD Date of Birth: [birth year] CSN: [id number] Age: 45 Admit Type: Outpatient Procedure:                Upper GI endoscopy Indications:              Nausea Providers:                Garlan Fair, MD, Cleda Daub, RN, Alfonso Patten, Technician, Dion Saucier, CRNA Referring MD:              Medicines:                Propofol per Anesthesia Complications:            No immediate complications. Estimated Blood Loss:     Estimated blood loss: none. Procedure:                Pre-Anesthesia Assessment:                           - Prior to the procedure, a History and Physical                            was performed, and patient medications and                            allergies were reviewed. The patient's tolerance of                            previous anesthesia was also reviewed. The risks                            and benefits of the procedure and the sedation                            options and risks were discussed with the patient.                            All questions were answered, and informed consent                            was obtained. Prior Anticoagulants: The patient has                            taken no previous anticoagulant or antiplatelet                            agents. ASA Grade Assessment: II - A patient with                            mild systemic disease. After reviewing the risks  and benefits, the patient was deemed in                            satisfactory condition to undergo the procedure.                           After obtaining informed consent, the endoscope was                            passed under direct vision. Throughout the                            procedure, the patient's blood pressure, pulse, and                            oxygen saturations  were monitored continuously. The                            [id number]) scope was introduced through the                            mouth, and advanced to the second part of duodenum.                            The upper GI endoscopy was accomplished without                            difficulty. The patient tolerated the procedure                            well. Scope In: Scope Out: Findings:      The Z-line was regular and was found 40 cm from the incisors.      The examined esophagus was normal.      The entire examined stomach was normal.      The examined duodenum was normal. Impression:               - Z-line regular, 40 cm from the incisors.                           - Normal esophagus.                           - Normal stomach.                           - Normal examined duodenum.                           - No specimens collected. Moderate Sedation:      N/A- Per Anesthesia Care Recommendation:           - Patient has a contact number available for                            emergencies. The signs and symptoms of potential  delayed complications were discussed with the                            patient. Return to normal activities tomorrow.                            Written discharge instructions were provided to the                            patient.                           - Return to primary care physician PRN.                           - Resume previous diet.                           - Continue present medications. Procedure Code(s):        --- Professional ---                           [phone], Esophagogastroduodenoscopy, flexible,                            transoral; diagnostic, including collection of                            specimen(s) by brushing or washing, when performed                            (separate procedure) Diagnosis Code(s):        --- Professional ---                           R11.0, Nausea CPT copyright  2016 American [employer] Association. All rights reserved. The codes documented in this report are preliminary and upon coder review may  be revised to meet current compliance requirements. Earle Gell, MD Garlan Fair, MD 12/28/2015 8:00:31 AM This report has been signed electronically. Number of Addenda: 0

## 2015-12-28 NOTE — Anesthesia Postprocedure Evaluation (Signed)
Anesthesia Post Note  Patient: Kristine Hayes  Procedure(s) Performed: Procedure(s) (LRB): FLEXIBLE SIGMOIDOSCOPY (N/A) ESOPHAGOGASTRODUODENOSCOPY (EGD) WITH PROPOFOL (N/A)  Patient location during evaluation: PACU Anesthesia Type: MAC Level of consciousness: awake and alert Pain management: pain level controlled Vital Signs Assessment: post-procedure vital signs reviewed and stable Respiratory status: spontaneous breathing, nonlabored ventilation, respiratory function stable and patient connected to nasal cannula oxygen Cardiovascular status: stable and blood pressure returned to baseline Anesthetic complications: no    Last Vitals:  Vitals:   12/28/15 0635 12/28/15 0803  BP: (!) 158/92   Pulse: 81 81  Resp: 12 14  Temp: 36.9 C 36.6 C    Last Pain:  Vitals:   12/28/15 0803  TempSrc: Oral                 Tiajuana Amass

## 2015-12-28 NOTE — Op Note (Signed)
[facility] Patient Name: Kristine Hayes Procedure Date: 12/28/2015 MRN: [id number] Attending MD: Garlan Fair , MD Date of Birth: [birth year] CSN: [id number] Age: 45 Admit Type: Outpatient Procedure:                Flexible Sigmoidoscopy Indications:              Ulcerative proctosigmoiditis associated with bloody                            diarrhea Providers:                Garlan Fair, MD, Cleda Daub, RN, Alfonso Patten, Technician, Dion Saucier, CRNA Referring MD:              Medicines:                Propofol per Anesthesia Complications:            No immediate complications. Estimated Blood Loss:     Estimated blood loss: none. Procedure:                Pre-Anesthesia Assessment:                           - Prior to the procedure, a History and Physical                            was performed, and patient medications and                            allergies were reviewed. The patient's tolerance of                            previous anesthesia was also reviewed. The risks                            and benefits of the procedure and the sedation                            options and risks were discussed with the patient.                            All questions were answered, and informed consent                            was obtained. Prior Anticoagulants: The patient has                            taken no previous anticoagulant or antiplatelet                            agents. ASA Grade Assessment: II - A patient with  mild systemic disease. After reviewing the risks                            and benefits, the patient was deemed in                            satisfactory condition to undergo the procedure.                           After obtaining informed consent, the scope was                            passed under direct vision. The Endoscope was                            introduced  through the anus and advanced to the the                            sigmoid colon. The [id number] scope was                            introduced through the anus and advanced to the the                            descending colon. The flexible sigmoidoscopy was                            accomplished without difficulty. The patient                            tolerated the procedure well. The quality of the                            bowel preparation was good. Scope In: Scope Out: Findings:      The perianal and digital rectal examinations were normal. Moderately       severe proctosigmoiditis extends to 30 cm from the anal verge. There is       generalized loss in the mucosal vascular pattern associated with mucosal       friability and generalized ulceration. The colonic mucosa is normal from       30 cm to 60 cm from the anal verge.      The exam was otherwise without abnormality. Impression:               - The examination was otherwise normal.                           - No specimens collected. Moderate Sedation:      N/A- Per Anesthesia Care Recommendation:           - Patient has a contact number available for                            emergencies. The signs and symptoms of potential  delayed complications were discussed with the                            patient. Return to normal activities tomorrow.                            Written discharge instructions were provided to the                            patient.                           - Return to primary care physician PRN. Procedure Code(s):        --- Professional ---                           [phone], Sigmoidoscopy, flexible; diagnostic,                            including collection of specimen(s) by brushing or                            washing, when performed (separate procedure) Diagnosis Code(s):        --- Professional ---                           K51.90, Ulcerative colitis,  unspecified, without                            complications CPT copyright 2016 American Medical Association. All rights reserved. The codes documented in this report are preliminary and upon coder review may  be revised to meet current compliance requirements. Earle Gell, MD Garlan Fair, MD 12/28/2015 8:07:33 AM This report has been signed electronically. Number of Addenda: 0

## 2015-12-29 ENCOUNTER — Encounter (HOSPITAL_COMMUNITY): Payer: Self-pay | Admitting: Gastroenterology

## 2016-01-28 DIAGNOSIS — K51311 Ulcerative (chronic) rectosigmoiditis with rectal bleeding: Secondary | ICD-10-CM | POA: Diagnosis not present

## 2016-03-02 DIAGNOSIS — K51311 Ulcerative (chronic) rectosigmoiditis with rectal bleeding: Secondary | ICD-10-CM | POA: Diagnosis not present

## 2016-03-09 DIAGNOSIS — H35462 Secondary vitreoretinal degeneration, left eye: Secondary | ICD-10-CM | POA: Diagnosis not present

## 2016-03-09 DIAGNOSIS — Z79899 Other long term (current) drug therapy: Secondary | ICD-10-CM | POA: Diagnosis not present

## 2016-03-09 DIAGNOSIS — H43813 Vitreous degeneration, bilateral: Secondary | ICD-10-CM | POA: Diagnosis not present

## 2016-03-10 DIAGNOSIS — Z79899 Other long term (current) drug therapy: Secondary | ICD-10-CM | POA: Diagnosis not present

## 2016-03-10 DIAGNOSIS — M329 Systemic lupus erythematosus, unspecified: Secondary | ICD-10-CM | POA: Diagnosis not present

## 2016-05-11 DIAGNOSIS — M9901 Segmental and somatic dysfunction of cervical region: Secondary | ICD-10-CM | POA: Diagnosis not present

## 2016-05-11 DIAGNOSIS — M545 Low back pain: Secondary | ICD-10-CM | POA: Diagnosis not present

## 2016-05-11 DIAGNOSIS — M542 Cervicalgia: Secondary | ICD-10-CM | POA: Diagnosis not present

## 2016-05-17 DIAGNOSIS — M542 Cervicalgia: Secondary | ICD-10-CM | POA: Diagnosis not present

## 2016-05-17 DIAGNOSIS — M9901 Segmental and somatic dysfunction of cervical region: Secondary | ICD-10-CM | POA: Diagnosis not present

## 2016-05-17 DIAGNOSIS — M545 Low back pain: Secondary | ICD-10-CM | POA: Diagnosis not present

## 2016-05-30 ENCOUNTER — Ambulatory Visit (INDEPENDENT_AMBULATORY_CARE_PROVIDER_SITE_OTHER): Payer: 59 | Admitting: Podiatry

## 2016-05-30 ENCOUNTER — Ambulatory Visit (INDEPENDENT_AMBULATORY_CARE_PROVIDER_SITE_OTHER): Payer: 59

## 2016-05-30 DIAGNOSIS — M7661 Achilles tendinitis, right leg: Secondary | ICD-10-CM

## 2016-05-30 DIAGNOSIS — M722 Plantar fascial fibromatosis: Secondary | ICD-10-CM | POA: Diagnosis not present

## 2016-05-30 DIAGNOSIS — M779 Enthesopathy, unspecified: Secondary | ICD-10-CM | POA: Diagnosis not present

## 2016-05-30 DIAGNOSIS — M7662 Achilles tendinitis, left leg: Secondary | ICD-10-CM | POA: Diagnosis not present

## 2016-05-30 MED ORDER — METHYLPREDNISOLONE 4 MG PO TBPK: ORAL_TABLET | ORAL | 0 refills | Status: DC

## 2016-05-30 MED ORDER — MELOXICAM 15 MG PO TABS: 15.0000 mg | ORAL_TABLET | Freq: Every day | ORAL | 0 refills | Status: DC

## 2016-06-01 ENCOUNTER — Telehealth: Payer: Self-pay | Admitting: Podiatry

## 2016-06-01 DIAGNOSIS — M542 Cervicalgia: Secondary | ICD-10-CM | POA: Diagnosis not present

## 2016-06-01 DIAGNOSIS — M9901 Segmental and somatic dysfunction of cervical region: Secondary | ICD-10-CM | POA: Diagnosis not present

## 2016-06-01 DIAGNOSIS — M545 Low back pain: Secondary | ICD-10-CM | POA: Diagnosis not present

## 2016-06-01 MED ORDER — METHYLPREDNISOLONE 4 MG PO TBPK: ORAL_TABLET | ORAL | 0 refills | Status: DC

## 2016-06-01 MED ORDER — MELOXICAM 15 MG PO TABS: 15.0000 mg | ORAL_TABLET | Freq: Every day | ORAL | 0 refills | Status: DC

## 2016-06-01 NOTE — Telephone Encounter (Signed)
I spoke with pt and told her the medication ordered would benefit her quicker if she began them sooner than waiting for mailorder. Pt states understanding and would like the medications sent to [facility] on [address].

## 2016-06-01 NOTE — Progress Notes (Signed)
   HPI: Patient is a 46 year old female presenting today as a new patient complaining of aching pain to the bilateral posterior heels and lateral sides of the feet, right worse than left, that has been present for the past month. She states the pain is worst in the morning. Standing for long periods of time increases the pain. She is interested in orthotics. She is here for further evaluation and treatment.    Physical Exam: General: The patient is alert and oriented x3 in no acute distress.  Dermatology: Skin is warm, dry and supple bilateral lower extremities. Negative for open lesions or macerations.  Vascular: Palpable pedal pulses bilaterally. No edema or erythema noted. Capillary refill within normal limits.  Neurological: Epicritic and protective threshold grossly intact bilaterally.   Musculoskeletal Exam: Pain with palpation at the insertion of bilateral Achilles tendons. Range of motion within normal limits to all pedal and ankle joints bilateral. Muscle strength 5/5 in all groups bilateral.   Radiographic Exam:  Normal osseous mineralization. Joint spaces preserved. No fracture/dislocation/boney destruction.    Assessment: 1. Bilateral Achilles tendinitis   Plan of Care:  1. Patient was evaluated. X-rays reviewed. 2. Prescription for Medrol Dosepak. 3. Prescription for meloxicam 15 mg. 4. Achilles tendinitis pads dispensed. 5. Appointment with a Liliane Channel for custom fitted orthotics. 6. Return to clinic in 4 weeks.    Edrick Kins, DPM [facility]  Dr. Edrick Kins, [address]                                        [place], [place] [address]                Office [phone]  Fax [phone]

## 2016-06-01 NOTE — Addendum Note (Signed)
Addended by: Harriett Sine D on: 06/01/2016 02:27 PM   Modules accepted: Orders

## 2016-06-01 NOTE — Telephone Encounter (Signed)
Pt. Needs her RX resent to [facility]. No longer uses express strips

## 2016-06-03 DIAGNOSIS — M545 Low back pain: Secondary | ICD-10-CM | POA: Diagnosis not present

## 2016-06-03 DIAGNOSIS — M9901 Segmental and somatic dysfunction of cervical region: Secondary | ICD-10-CM | POA: Diagnosis not present

## 2016-06-03 DIAGNOSIS — M542 Cervicalgia: Secondary | ICD-10-CM | POA: Diagnosis not present

## 2016-06-06 DIAGNOSIS — M9901 Segmental and somatic dysfunction of cervical region: Secondary | ICD-10-CM | POA: Diagnosis not present

## 2016-06-06 DIAGNOSIS — M542 Cervicalgia: Secondary | ICD-10-CM | POA: Diagnosis not present

## 2016-06-06 DIAGNOSIS — M545 Low back pain: Secondary | ICD-10-CM | POA: Diagnosis not present

## 2016-06-07 DIAGNOSIS — M545 Low back pain: Secondary | ICD-10-CM | POA: Diagnosis not present

## 2016-06-07 DIAGNOSIS — M542 Cervicalgia: Secondary | ICD-10-CM | POA: Diagnosis not present

## 2016-06-07 DIAGNOSIS — M9901 Segmental and somatic dysfunction of cervical region: Secondary | ICD-10-CM | POA: Diagnosis not present

## 2016-06-09 DIAGNOSIS — M545 Low back pain: Secondary | ICD-10-CM | POA: Diagnosis not present

## 2016-06-09 DIAGNOSIS — M9901 Segmental and somatic dysfunction of cervical region: Secondary | ICD-10-CM | POA: Diagnosis not present

## 2016-06-09 DIAGNOSIS — M542 Cervicalgia: Secondary | ICD-10-CM | POA: Diagnosis not present

## 2016-06-10 ENCOUNTER — Ambulatory Visit (INDEPENDENT_AMBULATORY_CARE_PROVIDER_SITE_OTHER): Payer: 59 | Admitting: Podiatry

## 2016-06-10 DIAGNOSIS — M7662 Achilles tendinitis, left leg: Secondary | ICD-10-CM | POA: Diagnosis not present

## 2016-06-10 DIAGNOSIS — M7661 Achilles tendinitis, right leg: Secondary | ICD-10-CM

## 2016-06-10 NOTE — Progress Notes (Signed)
Patient appt today for casting custom foot orthotics to address achilles tenonitis discomfort..Goal is to take pressure off achilles at calcaneal insertion.   Plan on custom f/o with 1/4" heel rise bilat.

## 2016-06-14 ENCOUNTER — Other Ambulatory Visit: Payer: 59

## 2016-06-27 ENCOUNTER — Encounter: Payer: Self-pay | Admitting: Podiatry

## 2016-06-27 ENCOUNTER — Ambulatory Visit (INDEPENDENT_AMBULATORY_CARE_PROVIDER_SITE_OTHER): Payer: 59 | Admitting: Podiatry

## 2016-06-27 DIAGNOSIS — M7662 Achilles tendinitis, left leg: Secondary | ICD-10-CM

## 2016-06-27 DIAGNOSIS — M7750 Other enthesopathy of unspecified foot: Secondary | ICD-10-CM | POA: Diagnosis not present

## 2016-06-27 DIAGNOSIS — M7661 Achilles tendinitis, right leg: Secondary | ICD-10-CM

## 2016-06-27 MED ORDER — DICLOFENAC SODIUM 75 MG PO TBEC: 75.0000 mg | DELAYED_RELEASE_TABLET | Freq: Two times a day (BID) | ORAL | 0 refills | Status: DC

## 2016-06-27 NOTE — Patient Instructions (Addendum)

## 2016-07-06 NOTE — Progress Notes (Signed)
   HPI: Patient is a 46 year old female presenting today for follow-up evaluation of bilateral Achilles tendinitis. She states her pain has worsened. She has taken a Medrol Dosepak and meloxicam with no significant relief of the pain. There are no modifying factors noted. She is here for further evaluation and treatment.   Physical Exam: General: The patient is alert and oriented x3 in no acute distress.  Dermatology: Skin is warm, dry and supple bilateral lower extremities. Negative for open lesions or macerations.  Vascular: Palpable pedal pulses bilaterally. No edema or erythema noted. Capillary refill within normal limits.  Neurological: Epicritic and protective threshold grossly intact bilaterally.   Musculoskeletal Exam: Pain with palpation at the insertion of bilateral Achilles tendons. Range of motion within normal limits to all pedal and ankle joints bilateral. Muscle strength 5/5 in all groups bilateral.   Assessment: 1. Bilateral Achilles tendinitis   Plan of Care:  1. Patient was evaluated.  2. Injection of 0.5 mLs Celestone Soluspan injected into the insertion of bilateral Achilles tendons. Care was taken to avoid direct injection into the tendon. 3. Prescription for diclofenac 75 mg twice a day given to patient. 4. Continue silicone Achilles pads and good shoe gear. 5. Return to clinic in 4 weeks.    Edrick Kins, DPM [facility]  Dr. Edrick Kins, [address]                                        [place], [place] [address]                Office [phone]  Fax [phone]

## 2016-07-11 MED ORDER — BETAMETHASONE SOD PHOS & ACET 6 (3-3) MG/ML IJ SUSP: 3.0000 mg | Freq: Once | INTRAMUSCULAR | Status: DC

## 2016-07-25 ENCOUNTER — Ambulatory Visit (INDEPENDENT_AMBULATORY_CARE_PROVIDER_SITE_OTHER): Payer: 59 | Admitting: Podiatry

## 2016-07-25 ENCOUNTER — Encounter: Payer: Self-pay | Admitting: Podiatry

## 2016-07-25 DIAGNOSIS — M7750 Other enthesopathy of unspecified foot: Secondary | ICD-10-CM

## 2016-08-01 ENCOUNTER — Ambulatory Visit: Payer: 59 | Admitting: *Deleted

## 2016-08-01 DIAGNOSIS — M7662 Achilles tendinitis, left leg: Secondary | ICD-10-CM

## 2016-08-01 DIAGNOSIS — M7661 Achilles tendinitis, right leg: Secondary | ICD-10-CM

## 2016-08-08 NOTE — Progress Notes (Signed)
Patient ID: Kristine Hayes, female   DOB: 03-Nov-1970, 46 y.o.   MRN: [id number]   Patient presents at Dr Amalia Hailey request.  She states that the orthotics hurt her heels ever since the heel lifts were added she feels like she has more pressure on her heels than before.  I ground off the heel lifts to see if this would relieve the pressure.  Patient tried them on and stated that they all ready felt better.  Patient is instructed to follow up if this does not elevate the pain.

## 2016-08-12 MED ORDER — BETAMETHASONE SOD PHOS & ACET 6 (3-3) MG/ML IJ SUSP: 3.0000 mg | Freq: Once | INTRAMUSCULAR | Status: DC

## 2016-08-12 NOTE — Progress Notes (Signed)
   HPI: Patient presents today for follow-up treatment and evaluation of bilateral Achilles tendinitis with the right more symptomatic on the left. Patient states that she's doing somewhat better. She believes that the diclofenac anti-inflammatory helped. She also complains of the orthotics are uncomfortable. Patient presents today for further treatment and evaluation   Physical Exam: General: The patient is alert and oriented x3 in no acute distress.  Dermatology: Skin is warm, dry and supple bilateral lower extremities. Negative for open lesions or macerations.  Vascular: Palpable pedal pulses bilaterally. No edema or erythema noted. Capillary refill within normal limits.  Neurological: Epicritic and protective threshold grossly intact bilaterally.   Musculoskeletal Exam: Pain with palpation at the insertion of bilateral Achilles tendons. Range of motion within normal limits to all pedal and ankle joints bilateral. Muscle strength 5/5 in all groups bilateral.   Assessment: 1. Bilateral Achilles tendinitis   Plan of Care:  1. Patient was evaluated.  2. Injection of 0.5 mLs Celestone Soluspan injected into the insertion of bilateral Achilles tendons. Care was taken to avoid direct injection into the tendon. 3. Continue diclofenac 75 mg when necessary 4. Today were going arrange an appointment with Richard to modify the patient's orthotics. The orthotics do not appear to be contouring the plantar arch of her foot appropriately. 5. Return to clinic when necessary   Edrick Kins, DPM [facility]  Dr. Edrick Kins, [address]                                        [place], [place] [address]                Office [phone]  Fax [phone]

## 2016-08-18 ENCOUNTER — Other Ambulatory Visit: Payer: 59 | Admitting: Orthotics

## 2016-09-05 ENCOUNTER — Other Ambulatory Visit: Payer: 59 | Admitting: Orthotics

## 2016-09-05 DIAGNOSIS — M329 Systemic lupus erythematosus, unspecified: Secondary | ICD-10-CM | POA: Diagnosis not present

## 2016-09-05 DIAGNOSIS — K5190 Ulcerative colitis, unspecified, without complications: Secondary | ICD-10-CM | POA: Diagnosis not present

## 2016-09-05 DIAGNOSIS — N390 Urinary tract infection, site not specified: Secondary | ICD-10-CM | POA: Diagnosis not present

## 2016-09-05 DIAGNOSIS — Z79899 Other long term (current) drug therapy: Secondary | ICD-10-CM | POA: Diagnosis not present

## 2016-09-20 DIAGNOSIS — Z0100 Encounter for examination of eyes and vision without abnormal findings: Secondary | ICD-10-CM | POA: Diagnosis not present

## 2016-10-20 ENCOUNTER — Other Ambulatory Visit: Payer: Self-pay | Admitting: Gynecology

## 2016-10-20 DIAGNOSIS — Z1231 Encounter for screening mammogram for malignant neoplasm of breast: Secondary | ICD-10-CM

## 2016-10-28 DIAGNOSIS — E559 Vitamin D deficiency, unspecified: Secondary | ICD-10-CM | POA: Diagnosis not present

## 2016-10-28 DIAGNOSIS — Z79899 Other long term (current) drug therapy: Secondary | ICD-10-CM | POA: Diagnosis not present

## 2016-10-28 DIAGNOSIS — I10 Essential (primary) hypertension: Secondary | ICD-10-CM | POA: Diagnosis not present

## 2016-10-28 DIAGNOSIS — Z0000 Encounter for general adult medical examination without abnormal findings: Secondary | ICD-10-CM | POA: Diagnosis not present

## 2016-10-31 DIAGNOSIS — N390 Urinary tract infection, site not specified: Secondary | ICD-10-CM | POA: Diagnosis not present

## 2016-11-01 ENCOUNTER — Ambulatory Visit
Admission: RE | Admit: 2016-11-01 | Discharge: 2016-11-01 | Disposition: A | Discharge status: Home / Self Care | Payer: 59 | Source: Ambulatory Visit | Attending: Gynecology | Admitting: Gynecology

## 2016-11-01 DIAGNOSIS — Z1231 Encounter for screening mammogram for malignant neoplasm of breast: Secondary | ICD-10-CM

## 2016-11-11 DIAGNOSIS — Z23 Encounter for immunization: Secondary | ICD-10-CM | POA: Diagnosis not present

## 2016-11-14 ENCOUNTER — Ambulatory Visit (INDEPENDENT_AMBULATORY_CARE_PROVIDER_SITE_OTHER): Payer: 59 | Admitting: Gynecology

## 2016-11-14 ENCOUNTER — Encounter: Payer: Self-pay | Admitting: Gynecology

## 2016-11-14 VITALS — BP 122/78 | Ht 65.0 in | Wt 289.0 lb

## 2016-11-14 DIAGNOSIS — E669 Obesity, unspecified: Secondary | ICD-10-CM

## 2016-11-14 DIAGNOSIS — Z01419 Encounter for gynecological examination (general) (routine) without abnormal findings: Secondary | ICD-10-CM | POA: Diagnosis not present

## 2016-11-14 LAB — TSH: TSH: 1.02 m[IU]/L

## 2016-11-14 NOTE — Progress Notes (Signed)
    Kristine Hayes 12-21-1970 [id number]        46 y.o.  G2P0020 for annual gynecologic exam.  Doing well without complaints.  Past medical history,surgical history, problem list, medications, allergies, family history and social history were all reviewed and documented as reviewed in the EPIC chart.  ROS:  Performed with pertinent positives and negatives included in the history, assessment and plan.   Additional significant findings : None   Exam: Kristine Hayes assistant Vitals:   11/14/16 1552  BP: 122/78  Weight: 289 lb (131.1 kg)  Height: 5\' 5"  (1.651 m)   Body mass index is 48.09 kg/m.  General appearance:  Normal affect, orientation and appearance. Skin: Grossly normal HEENT: Without gross lesions.  No cervical or supraclavicular adenopathy. Thyroid normal.  Lungs:  Clear without wheezing, rales or rhonchi Cardiac: RR, without RMG Abdominal:  Soft, nontender, without masses, guarding, rebound, organomegaly or hernia Breasts:  Examined lying and sitting without masses, retractions, discharge or axillary adenopathy. Pelvic:  Ext, BUS, Vagina: Normal.  Pap smear of the vaginal cuff done  Adnexa: Without masses or tenderness    Anus and perineum: Normal   Rectovaginal: Normal sphincter tone without palpated masses or tenderness.    Assessment/Plan:  46 y.o. G70P0020 female for annual gynecologic exam.  Pap status post TAH 2014 for leiomyoma.   1. Overall doing well without hot flashes, night sweats or vaginal dryness.  We discussed the perimenopausal time period and what to expect.  She will follow-up if she starts to develop significant menopausal symptoms. 2. Mammography 10/2016.  Continue with annual mammography next year.  Breast exam normal today. 3. Pap smear 2015.  Pap smear done today.  No history of significant abnormal Pap smears.  Options to stop screening altogether based on hysterectomy history and current screening guidelines reviewed.  Will readdress on an  annual basis. 4. Health maintenance.  Patient reports routine blood work done elsewhere but does not think her thyroid was checked.  Given her obesity we will go ahead and check her TSH today.  Otherwise she will continue to follow-up with her primary physician for routine health care and lab work.  Follow-up 1 year for annual gynecologic exam.   Kristine Auerbach MD, 4:22 PM 11/14/2016

## 2016-11-14 NOTE — Patient Instructions (Signed)
Follow-up in 1 year for annual exam, sooner if any issues. 

## 2016-11-14 NOTE — Addendum Note (Signed)
Addended by: Nelva Nay on: 11/14/2016 04:27 PM   Modules accepted: Orders

## 2016-11-16 LAB — PAP IG W/ RFLX HPV ASCU

## 2017-02-13 DIAGNOSIS — L659 Nonscarring hair loss, unspecified: Secondary | ICD-10-CM | POA: Diagnosis not present

## 2017-02-13 DIAGNOSIS — R5383 Other fatigue: Secondary | ICD-10-CM | POA: Diagnosis not present

## 2017-02-13 DIAGNOSIS — E538 Deficiency of other specified B group vitamins: Secondary | ICD-10-CM | POA: Diagnosis not present

## 2017-03-08 DIAGNOSIS — K5190 Ulcerative colitis, unspecified, without complications: Secondary | ICD-10-CM | POA: Diagnosis not present

## 2017-03-08 DIAGNOSIS — M329 Systemic lupus erythematosus, unspecified: Secondary | ICD-10-CM | POA: Diagnosis not present

## 2017-03-08 DIAGNOSIS — M25569 Pain in unspecified knee: Secondary | ICD-10-CM | POA: Diagnosis not present

## 2017-03-08 DIAGNOSIS — Z79899 Other long term (current) drug therapy: Secondary | ICD-10-CM | POA: Diagnosis not present

## 2017-03-21 DIAGNOSIS — L668 Other cicatricial alopecia: Secondary | ICD-10-CM | POA: Diagnosis not present

## 2017-03-21 DIAGNOSIS — L218 Other seborrheic dermatitis: Secondary | ICD-10-CM | POA: Diagnosis not present

## 2017-05-17 DIAGNOSIS — Z79899 Other long term (current) drug therapy: Secondary | ICD-10-CM | POA: Diagnosis not present

## 2017-05-17 DIAGNOSIS — H35462 Secondary vitreoretinal degeneration, left eye: Secondary | ICD-10-CM | POA: Diagnosis not present

## 2017-05-17 DIAGNOSIS — H43813 Vitreous degeneration, bilateral: Secondary | ICD-10-CM | POA: Diagnosis not present

## 2017-09-06 DIAGNOSIS — Z79899 Other long term (current) drug therapy: Secondary | ICD-10-CM | POA: Diagnosis not present

## 2017-09-06 DIAGNOSIS — K5190 Ulcerative colitis, unspecified, without complications: Secondary | ICD-10-CM | POA: Diagnosis not present

## 2017-09-06 DIAGNOSIS — M329 Systemic lupus erythematosus, unspecified: Secondary | ICD-10-CM | POA: Diagnosis not present

## 2017-09-14 DIAGNOSIS — M2550 Pain in unspecified joint: Secondary | ICD-10-CM | POA: Diagnosis not present

## 2017-09-14 DIAGNOSIS — M3210 Systemic lupus erythematosus, organ or system involvement unspecified: Secondary | ICD-10-CM | POA: Diagnosis not present

## 2017-09-29 ENCOUNTER — Other Ambulatory Visit: Payer: Self-pay | Admitting: Gynecology

## 2017-09-29 DIAGNOSIS — Z1231 Encounter for screening mammogram for malignant neoplasm of breast: Secondary | ICD-10-CM

## 2017-10-06 DIAGNOSIS — Z23 Encounter for immunization: Secondary | ICD-10-CM | POA: Diagnosis not present

## 2017-10-18 DIAGNOSIS — I8311 Varicose veins of right lower extremity with inflammation: Secondary | ICD-10-CM | POA: Diagnosis not present

## 2017-11-03 ENCOUNTER — Ambulatory Visit: Payer: 59

## 2017-11-15 ENCOUNTER — Ambulatory Visit (INDEPENDENT_AMBULATORY_CARE_PROVIDER_SITE_OTHER): Payer: 59 | Admitting: Gynecology

## 2017-11-15 ENCOUNTER — Encounter: Payer: Self-pay | Admitting: Gynecology

## 2017-11-15 VITALS — BP 134/82 | Ht 65.5 in | Wt 306.0 lb

## 2017-11-15 DIAGNOSIS — Z01419 Encounter for gynecological examination (general) (routine) without abnormal findings: Secondary | ICD-10-CM

## 2017-11-15 NOTE — Patient Instructions (Signed)
Follow-up in 1 year for annual exam, sooner if any issues. 

## 2017-11-15 NOTE — Progress Notes (Signed)
    Kristine Hayes 08-16-1970 [id number]        47 y.o.  G2P0020 for annual gynecologic exam.  Without gynecologic complaints.  Past medical history,surgical history, problem list, medications, allergies, family history and social history were all reviewed and documented as reviewed in the EPIC chart.  ROS:  Performed with pertinent positives and negatives included in the history, assessment and plan.   Additional significant findings : None   Exam: Kristine Hayes assistant Vitals:   11/15/17 1601  BP: 134/82  Weight: (!) 306 lb (138.8 kg)  Height: 5' 5.5" (1.664 m)   Body mass index is 50.15 kg/m.  General appearance:  Normal affect, orientation and appearance. Skin: Grossly normal HEENT: Without gross lesions.  No cervical or supraclavicular adenopathy. Thyroid normal.  Lungs:  Clear without wheezing, rales or rhonchi Cardiac: RR, without RMG Abdominal:  Soft, nontender, without masses, guarding, rebound, organomegaly or hernia Breasts:  Examined lying and sitting without masses, retractions, discharge or axillary adenopathy. Pelvic:  Ext, BUS, Vagina: Normal  Adnexa: Without masses or tenderness    Anus and perineum: Normal   Rectovaginal: Normal sphincter tone without palpated masses or tenderness.    Assessment/Plan:  47 y.o. G29P0020 female for annual gynecologic exam status post TAH 2014 for leiomyoma.   1. Doing well without significant menopausal symptoms.  Continue to monitor and report any significant symptoms.  We discussed what to expect in the perimenopausal timeframe. 2. Pap smear 2018.  No Pap smear done today.  No history of significant abnormal Pap smears.  Options to stop screening per current screening guidelines based on hysterectomy history reviewed.  Will readdress on an annual basis. 3. Mammography scheduled in several weeks.  Breast exam normal today. 4. Health maintenance.  No routine lab work done as patient reports this is going to be done in November  with her primary physician appointment.  Follow-up in 1 year, sooner as needed.   Kristine Auerbach MD, 4:29 PM 11/15/2017

## 2017-11-22 DIAGNOSIS — H43813 Vitreous degeneration, bilateral: Secondary | ICD-10-CM | POA: Diagnosis not present

## 2017-11-22 DIAGNOSIS — Z79899 Other long term (current) drug therapy: Secondary | ICD-10-CM | POA: Diagnosis not present

## 2017-11-22 DIAGNOSIS — H35462 Secondary vitreoretinal degeneration, left eye: Secondary | ICD-10-CM | POA: Diagnosis not present

## 2017-11-28 DIAGNOSIS — Z79899 Other long term (current) drug therapy: Secondary | ICD-10-CM | POA: Diagnosis not present

## 2017-11-28 DIAGNOSIS — Z0000 Encounter for general adult medical examination without abnormal findings: Secondary | ICD-10-CM | POA: Diagnosis not present

## 2017-11-28 DIAGNOSIS — N3000 Acute cystitis without hematuria: Secondary | ICD-10-CM | POA: Diagnosis not present

## 2017-11-28 DIAGNOSIS — I10 Essential (primary) hypertension: Secondary | ICD-10-CM | POA: Diagnosis not present

## 2017-11-28 DIAGNOSIS — E559 Vitamin D deficiency, unspecified: Secondary | ICD-10-CM | POA: Diagnosis not present

## 2017-11-30 ENCOUNTER — Telehealth: Payer: Self-pay | Admitting: Gynecology

## 2017-11-30 NOTE — Telephone Encounter (Signed)
Left message for patient to call.

## 2017-11-30 NOTE — Telephone Encounter (Signed)
Patient informed with the below note.

## 2017-11-30 NOTE — Telephone Encounter (Signed)
Tell patient I received a copy of labs from her other doctors office.  Her vitamin D level was low and I just wanted to make sure that her other physician let her know this and that she should be on supplemental vitamin D.

## 2017-12-06 ENCOUNTER — Ambulatory Visit
Admission: RE | Admit: 2017-12-06 | Discharge: 2017-12-06 | Disposition: A | Discharge status: Home / Self Care | Payer: 59 | Source: Ambulatory Visit | Attending: Gynecology | Admitting: Gynecology

## 2017-12-06 DIAGNOSIS — Z1231 Encounter for screening mammogram for malignant neoplasm of breast: Secondary | ICD-10-CM | POA: Diagnosis not present

## 2017-12-20 DIAGNOSIS — R194 Change in bowel habit: Secondary | ICD-10-CM | POA: Diagnosis not present

## 2017-12-20 DIAGNOSIS — K51311 Ulcerative (chronic) rectosigmoiditis with rectal bleeding: Secondary | ICD-10-CM | POA: Diagnosis not present

## 2017-12-20 DIAGNOSIS — K625 Hemorrhage of anus and rectum: Secondary | ICD-10-CM | POA: Diagnosis not present

## 2018-01-31 DIAGNOSIS — I8311 Varicose veins of right lower extremity with inflammation: Secondary | ICD-10-CM | POA: Diagnosis not present

## 2018-02-07 DIAGNOSIS — I8311 Varicose veins of right lower extremity with inflammation: Secondary | ICD-10-CM | POA: Diagnosis not present

## 2018-02-16 DIAGNOSIS — R0681 Apnea, not elsewhere classified: Secondary | ICD-10-CM | POA: Diagnosis not present

## 2018-02-16 DIAGNOSIS — Z6841 Body Mass Index (BMI) 40.0 and over, adult: Secondary | ICD-10-CM | POA: Diagnosis not present

## 2018-02-16 DIAGNOSIS — Z23 Encounter for immunization: Secondary | ICD-10-CM | POA: Diagnosis not present

## 2018-03-07 DIAGNOSIS — M329 Systemic lupus erythematosus, unspecified: Secondary | ICD-10-CM | POA: Diagnosis not present

## 2018-03-07 DIAGNOSIS — K5190 Ulcerative colitis, unspecified, without complications: Secondary | ICD-10-CM | POA: Diagnosis not present

## 2018-03-07 DIAGNOSIS — M1990 Unspecified osteoarthritis, unspecified site: Secondary | ICD-10-CM | POA: Diagnosis not present

## 2018-03-07 DIAGNOSIS — Z79899 Other long term (current) drug therapy: Secondary | ICD-10-CM | POA: Diagnosis not present

## 2018-07-09 DIAGNOSIS — Z23 Encounter for immunization: Secondary | ICD-10-CM | POA: Diagnosis not present

## 2018-08-07 DIAGNOSIS — I8311 Varicose veins of right lower extremity with inflammation: Secondary | ICD-10-CM | POA: Diagnosis not present

## 2018-09-05 DIAGNOSIS — Z79899 Other long term (current) drug therapy: Secondary | ICD-10-CM | POA: Diagnosis not present

## 2018-09-05 DIAGNOSIS — M329 Systemic lupus erythematosus, unspecified: Secondary | ICD-10-CM | POA: Diagnosis not present

## 2018-09-05 DIAGNOSIS — M1990 Unspecified osteoarthritis, unspecified site: Secondary | ICD-10-CM | POA: Diagnosis not present

## 2018-09-05 DIAGNOSIS — K5190 Ulcerative colitis, unspecified, without complications: Secondary | ICD-10-CM | POA: Diagnosis not present

## 2018-10-10 ENCOUNTER — Encounter: Payer: Self-pay | Admitting: Gynecology

## 2018-10-31 ENCOUNTER — Other Ambulatory Visit: Payer: Self-pay | Admitting: Gynecology

## 2018-10-31 DIAGNOSIS — Z1231 Encounter for screening mammogram for malignant neoplasm of breast: Secondary | ICD-10-CM

## 2018-11-19 ENCOUNTER — Other Ambulatory Visit: Payer: Self-pay

## 2018-11-20 ENCOUNTER — Encounter: Payer: Self-pay | Admitting: Gynecology

## 2018-11-20 ENCOUNTER — Ambulatory Visit (INDEPENDENT_AMBULATORY_CARE_PROVIDER_SITE_OTHER): Payer: BC Managed Care – PPO | Admitting: Gynecology

## 2018-11-20 VITALS — BP 124/82 | Ht 65.5 in | Wt 316.0 lb

## 2018-11-20 DIAGNOSIS — Z01419 Encounter for gynecological examination (general) (routine) without abnormal findings: Secondary | ICD-10-CM

## 2018-11-20 DIAGNOSIS — N898 Other specified noninflammatory disorders of vagina: Secondary | ICD-10-CM | POA: Diagnosis not present

## 2018-11-20 LAB — WET PREP FOR TRICH, YEAST, CLUE

## 2018-11-20 MED ORDER — FLUCONAZOLE 150 MG PO TABS: 150.0000 mg | ORAL_TABLET | Freq: Once | ORAL | 0 refills | Status: AC

## 2018-11-20 NOTE — Patient Instructions (Signed)
Take the 1 Diflucan pill now to treat the intermittent itching which may be due to yeast.  Take the second pill as needed in the future for symptoms.  Follow-up in 1 year for annual exam, sooner as needed.

## 2018-11-20 NOTE — Progress Notes (Signed)
    ARABEL BOO 1970/11/06 [id number]        48 y.o.  G2P0020 for annual gynecologic exam.  Complaining of intermittent vaginal itching with some discharge.  Comes and goes.  Last episode about 2 weeks ago.  Currently not having symptoms.  No odor.  No urinary symptoms.  Past medical history,surgical history, problem list, medications, allergies, family history and social history were all reviewed and documented as reviewed in the EPIC chart.  ROS:  Performed with pertinent positives and negatives included in the history, assessment and plan.   Additional significant findings : None   Exam: [name] assistant Vitals:   11/20/18 1553  BP: 124/82  Weight: (!) 316 lb (143.3 kg)  Height: 5' 5.5" (1.664 m)   Body mass index is 51.79 kg/m.  General appearance:  Normal affect, orientation and appearance. Skin: Grossly normal HEENT: Without gross lesions.  No cervical or supraclavicular adenopathy. Thyroid normal.  Lungs:  Clear without wheezing, rales or rhonchi Cardiac: RR, without RMG Abdominal:  Soft, nontender, without masses, guarding, rebound, organomegaly or hernia Breasts:  Examined lying and sitting without masses, retractions, discharge or axillary adenopathy. Pelvic:  Ext, BUS, Vagina: Slight white discharge noted.  Adnexa: Without masses or tenderness    Anus and perineum: Normal   Rectovaginal: Normal sphincter tone without palpated masses or tenderness.    Assessment/Plan:  48 y.o. G77P0020 female for annual gynecologic exam.  Status post TAH 2014 for leiomyoma.  No menopausal symptoms.  1. Intermittent vaginal itching with discharge.  Wet prep is negative.  Historically sounds like yeast.  Will treat with Diflucan 150 mg x 1 dose now.  Second pill given to use in the future as needed.  Follow-up if symptoms would persist or become recurrent despite above treatment. 2. Mammography coming due when she has it scheduled.  Breast exam normal today. 3. Pap smear 2018.   No Pap smear done today.  No history of significant abnormal Pap smears.  Options to stop screening per current screening guidelines reviewed status post hysterectomy.  Will readdress on annual basis. 4. Health maintenance.  No routine lab work done as patient does this elsewhere.  Follow-up 1 year, sooner as needed.   [name] MD, 4:24 PM 11/20/2018

## 2018-12-18 ENCOUNTER — Other Ambulatory Visit: Payer: Self-pay

## 2018-12-18 ENCOUNTER — Ambulatory Visit
Admission: RE | Admit: 2018-12-18 | Discharge: 2018-12-18 | Disposition: A | Discharge status: Home / Self Care | Payer: BC Managed Care – PPO | Source: Ambulatory Visit | Attending: Gynecology | Admitting: Gynecology

## 2018-12-18 DIAGNOSIS — Z1231 Encounter for screening mammogram for malignant neoplasm of breast: Secondary | ICD-10-CM | POA: Diagnosis not present

## 2018-12-20 ENCOUNTER — Other Ambulatory Visit: Payer: Self-pay | Admitting: Gynecology

## 2018-12-20 DIAGNOSIS — R928 Other abnormal and inconclusive findings on diagnostic imaging of breast: Secondary | ICD-10-CM

## 2018-12-24 ENCOUNTER — Ambulatory Visit
Admission: RE | Admit: 2018-12-24 | Discharge: 2018-12-24 | Disposition: A | Discharge status: Home / Self Care | Payer: BC Managed Care – PPO | Source: Ambulatory Visit | Attending: Gynecology | Admitting: Gynecology

## 2018-12-24 ENCOUNTER — Other Ambulatory Visit: Payer: Self-pay

## 2018-12-24 DIAGNOSIS — R590 Localized enlarged lymph nodes: Secondary | ICD-10-CM | POA: Diagnosis not present

## 2018-12-24 DIAGNOSIS — R928 Other abnormal and inconclusive findings on diagnostic imaging of breast: Secondary | ICD-10-CM

## 2018-12-26 DIAGNOSIS — H43813 Vitreous degeneration, bilateral: Secondary | ICD-10-CM | POA: Diagnosis not present

## 2018-12-26 DIAGNOSIS — H35462 Secondary vitreoretinal degeneration, left eye: Secondary | ICD-10-CM | POA: Diagnosis not present

## 2018-12-26 DIAGNOSIS — Z79899 Other long term (current) drug therapy: Secondary | ICD-10-CM | POA: Diagnosis not present

## 2018-12-26 DIAGNOSIS — H43391 Other vitreous opacities, right eye: Secondary | ICD-10-CM | POA: Diagnosis not present

## 2018-12-27 ENCOUNTER — Telehealth: Payer: Self-pay | Admitting: *Deleted

## 2018-12-27 NOTE — Telephone Encounter (Signed)
Spoke with patient and informed below, patient reports she is currently having a lupus "flare up" having issues with her feet as well the doctor that handles her Lupus is aware of this "flare up" as well. Reports the radiologist told her both sides are enlarged but the left side more so than the right, but the radiologist did not see a concern.  I did explained your recommendation again. Patient asked me to relay this information to you and confirm you still want her to schedule office visit? Please advise

## 2018-12-27 NOTE — Telephone Encounter (Signed)
The issue is not to miss some of the reason for lymphadenopathy.  It is certainly reasonable that the lymphadenopathy is due to a lupus flare but there are other reasons for lymphadenopathy to include lymphoma etc.  Options are to observe, get the lupus flare under control and relook at the lymph nodes or proceed with lymph node biopsy now.  I would recommend she discuss this with her lupus physician and get their input as to what they recommend.

## 2018-12-27 NOTE — Telephone Encounter (Signed)
Patient informed with below, will follow up with lupus MD.

## 2018-12-27 NOTE — Telephone Encounter (Signed)
-----   Message from [name], MD sent at 12/27/2018  8:31 AM EST ----- Schedule patient for office appointment reference enlarged lymph nodes in axilla on mammogram

## 2019-01-14 DIAGNOSIS — Z1159 Encounter for screening for other viral diseases: Secondary | ICD-10-CM | POA: Diagnosis not present

## 2019-01-17 DIAGNOSIS — Z1211 Encounter for screening for malignant neoplasm of colon: Secondary | ICD-10-CM | POA: Diagnosis not present

## 2019-01-17 DIAGNOSIS — K5150 Left sided colitis without complications: Secondary | ICD-10-CM | POA: Diagnosis not present

## 2019-01-17 DIAGNOSIS — K5289 Other specified noninfective gastroenteritis and colitis: Secondary | ICD-10-CM | POA: Diagnosis not present

## 2019-01-24 DIAGNOSIS — Z79899 Other long term (current) drug therapy: Secondary | ICD-10-CM | POA: Diagnosis not present

## 2019-01-24 DIAGNOSIS — I10 Essential (primary) hypertension: Secondary | ICD-10-CM | POA: Diagnosis not present

## 2019-01-24 DIAGNOSIS — Z0000 Encounter for general adult medical examination without abnormal findings: Secondary | ICD-10-CM | POA: Diagnosis not present

## 2019-01-24 DIAGNOSIS — E559 Vitamin D deficiency, unspecified: Secondary | ICD-10-CM | POA: Diagnosis not present

## 2019-01-28 DIAGNOSIS — M329 Systemic lupus erythematosus, unspecified: Secondary | ICD-10-CM | POA: Diagnosis not present

## 2019-01-28 DIAGNOSIS — M1990 Unspecified osteoarthritis, unspecified site: Secondary | ICD-10-CM | POA: Diagnosis not present

## 2019-01-28 DIAGNOSIS — Z79899 Other long term (current) drug therapy: Secondary | ICD-10-CM | POA: Diagnosis not present

## 2019-01-28 DIAGNOSIS — K5190 Ulcerative colitis, unspecified, without complications: Secondary | ICD-10-CM | POA: Diagnosis not present

## 2019-03-05 DIAGNOSIS — I10 Essential (primary) hypertension: Secondary | ICD-10-CM | POA: Diagnosis not present

## 2019-04-29 DIAGNOSIS — M1990 Unspecified osteoarthritis, unspecified site: Secondary | ICD-10-CM | POA: Diagnosis not present

## 2019-04-29 DIAGNOSIS — K5190 Ulcerative colitis, unspecified, without complications: Secondary | ICD-10-CM | POA: Diagnosis not present

## 2019-04-29 DIAGNOSIS — Z79899 Other long term (current) drug therapy: Secondary | ICD-10-CM | POA: Diagnosis not present

## 2019-04-29 DIAGNOSIS — M329 Systemic lupus erythematosus, unspecified: Secondary | ICD-10-CM | POA: Diagnosis not present

## 2019-05-15 ENCOUNTER — Ambulatory Visit: Payer: BC Managed Care – PPO | Admitting: Internal Medicine

## 2019-05-23 ENCOUNTER — Ambulatory Visit
Admission: RE | Admit: 2019-05-23 | Discharge: 2019-05-23 | Disposition: A | Discharge status: Home / Self Care | Payer: BC Managed Care – PPO | Source: Ambulatory Visit | Attending: Internal Medicine | Admitting: Internal Medicine

## 2019-05-23 ENCOUNTER — Other Ambulatory Visit: Payer: Self-pay

## 2019-05-23 ENCOUNTER — Ambulatory Visit: Payer: BC Managed Care – PPO | Admitting: Internal Medicine

## 2019-05-23 ENCOUNTER — Encounter: Payer: Self-pay | Admitting: Internal Medicine

## 2019-05-23 DIAGNOSIS — R7611 Nonspecific reaction to tuberculin skin test without active tuberculosis: Secondary | ICD-10-CM | POA: Diagnosis not present

## 2019-05-23 DIAGNOSIS — Z227 Latent tuberculosis: Secondary | ICD-10-CM | POA: Diagnosis not present

## 2019-05-23 MED ORDER — RIFAMPIN 300 MG PO CAPS: 600.0000 mg | ORAL_CAPSULE | Freq: Every day | ORAL | 3 refills | Status: DC

## 2019-05-23 NOTE — Progress Notes (Signed)
[facility]      Reason for Consult: Latent Tb    Referring Physician: Dr. Kathlene November    Patient ID: Kristine Hayes, female    DOB: Jun 12, 1970, 49 y.o.   MRN: [id number]  HPI:   Here for evaluation of latent Tb. She has a history of SLE and UC and has been on plaquinil for that but more recently has been more symptomatic with her SLE.  She has a known history of latent Tb and thinks she was exposed to someone as a child, but is unclear of any specifics.  She was seen at the HD about 3 years ago and a ppd was placed after a positive Quantiferon Gold test and was minimally reactive by her report and was not offered treatment.  She though has more recently been evaluated by Dr. Kathlene November with the intention to start methotrexate for her SLE and had a repeat Quantiferon Gold test which again is positive.  She has never been on treatment for latent Tb before.   She reports a recent colonoscopy in December was clear.    Past Medical History:  Diagnosis Date  . Hypertension    BORDERLINE  . Lupus (HCC)    DX-SKIN BIOPSY-10  . Shortness of breath    on exertion due to wt gain per pt  . Sleep apnea    mild-no CPAP    Prior to Admission medications   Medication Sig Start Date End Date Taking? Authorizing Provider  Ascorbic Acid (VITAMIN C) 500 MG CHEW Chew 500 mg by mouth daily.    [provider]  Biotin 5000 MCG TABS Take 5,000 mcg by mouth daily.     [provider]  CALCIUM-MAGNESIUM PO Take 1 tablet by mouth daily.    [provider]  Cholecalciferol (VITAMIN D3) 5000 units CAPS Take 5,000 Units by mouth daily.    [provider]  fish oil-omega-3 fatty acids 1000 MG capsule Take 1 g by mouth daily.     [provider]  hydroxychloroquine (PLAQUENIL) 200 MG tablet Take 200 mg by mouth daily.     [provider]  losartan-hydrochlorothiazide (HYZAAR) 100-12.5 MG tablet Take 0.5 tablets by mouth daily.      [provider]  Multiple Vitamin (MULTIVITAMIN) tablet Take 1 tablet by mouth daily.    [provider]  rifampin (RIFADIN) 300 MG capsule Take 2 capsules (600 mg total) by mouth daily. 05/23/19   Rajan Burgard, Okey Regal, MD  valACYclovir (VALTREX) 1000 MG tablet Take 1,000 mg by mouth 2 (two) times daily.    [provider]    Allergies  Allergen Reactions  . Celecoxib Anaphylaxis    REACTION: throat tightness, pt says she can take Ibuprofen without any problems    Social History   Tobacco Use  . Smoking status: Never Smoker  . Smokeless tobacco: Never Used  Substance Use Topics  . Alcohol use: No    Alcohol/week: 0.0 standard drinks  . Drug use: No    Family History  Problem Relation Age of Onset  . Breast cancer Maternal Aunt        Age unknown    Review of Systems  Constitutional: negative for fevers, chills, anorexia and weight loss Respiratory: negative for cough or sputum Gastrointestinal: negative for diarrhea All other systems reviewed and are negative    Constitutional: in no apparent distress  Vitals:   05/23/19 1005  BP: (!) 145/88  Pulse: 76  EYES: anicteric Cardiovascular: Cor RRR Respiratory: clear GI: Bowel sounds are normal, liver is not enlarged, spleen is not enlarged Musculoskeletal: no edema Skin: negatives: no rash Neuro: non-focal  Labs: Lab Results  Component Value Date   WBC 17.4 (H) 09/22/2015   HGB 13.8 09/22/2015   HCT 39.0 09/22/2015   MCV 85.3 09/22/2015   PLT 181 09/22/2015    Lab Results  Component Value Date   CREATININE 1.27 (H) 09/22/2015   BUN 10 09/22/2015   NA 135 09/22/2015   K 3.3 (L) 09/22/2015   CL 104 09/22/2015   CO2 25 09/22/2015    Lab Results  Component Value Date   ALT 17 09/22/2015   AST 25 09/22/2015   ALKPHOS 45 09/22/2015   BILITOT 1.6 (H) 09/22/2015     Assessment: likely latent Tb.  Will check a CXR and if ok, proceed with treatment.  Different treatment options  discussed and will start rifampin for 4 months.  Side effects discussed.    Baseline LFTs from [facility]/rheumatology wnl  Plan: 1) rifampin 600 mg daily for 4 months 2) hepatic panel and HIV in 1 month at follow up

## 2019-06-18 DIAGNOSIS — Z79899 Other long term (current) drug therapy: Secondary | ICD-10-CM | POA: Diagnosis not present

## 2019-06-18 DIAGNOSIS — K5190 Ulcerative colitis, unspecified, without complications: Secondary | ICD-10-CM | POA: Diagnosis not present

## 2019-06-18 DIAGNOSIS — M1990 Unspecified osteoarthritis, unspecified site: Secondary | ICD-10-CM | POA: Diagnosis not present

## 2019-06-18 DIAGNOSIS — M329 Systemic lupus erythematosus, unspecified: Secondary | ICD-10-CM | POA: Diagnosis not present

## 2019-07-02 ENCOUNTER — Ambulatory Visit: Payer: BC Managed Care – PPO | Admitting: Internal Medicine

## 2019-07-31 DIAGNOSIS — K51311 Ulcerative (chronic) rectosigmoiditis with rectal bleeding: Secondary | ICD-10-CM | POA: Diagnosis not present

## 2019-08-13 DIAGNOSIS — Z79899 Other long term (current) drug therapy: Secondary | ICD-10-CM | POA: Diagnosis not present

## 2019-08-13 DIAGNOSIS — H43813 Vitreous degeneration, bilateral: Secondary | ICD-10-CM | POA: Diagnosis not present

## 2019-08-13 DIAGNOSIS — H35462 Secondary vitreoretinal degeneration, left eye: Secondary | ICD-10-CM | POA: Diagnosis not present

## 2019-08-13 DIAGNOSIS — H43391 Other vitreous opacities, right eye: Secondary | ICD-10-CM | POA: Diagnosis not present

## 2019-09-17 ENCOUNTER — Ambulatory Visit: Payer: BC Managed Care – PPO | Attending: Critical Care Medicine

## 2019-09-17 DIAGNOSIS — Z23 Encounter for immunization: Secondary | ICD-10-CM

## 2019-09-17 NOTE — Progress Notes (Signed)
   Covid-19 Vaccination Clinic  Name:  Kristine Hayes    MRN: [id number] DOB: September 14, 1970  09/17/2019  [name] was observed post Covid-19 immunization for 15 minutes without incident. She was provided with Vaccine Information Sheet and instruction to access the V-Safe system.   [name] was instructed to call 911 with any severe reactions post vaccine: Marland Kitchen Difficulty breathing  . Swelling of face and throat  . A fast heartbeat  . A bad rash all over body  . Dizziness and weakness

## 2019-09-25 DIAGNOSIS — K5130 Ulcerative (chronic) rectosigmoiditis without complications: Secondary | ICD-10-CM | POA: Diagnosis not present

## 2019-10-28 DIAGNOSIS — Z20822 Contact with and (suspected) exposure to covid-19: Secondary | ICD-10-CM | POA: Diagnosis not present

## 2019-11-08 ENCOUNTER — Ambulatory Visit (HOSPITAL_COMMUNITY): Payer: Self-pay

## 2019-11-12 DIAGNOSIS — Z79899 Other long term (current) drug therapy: Secondary | ICD-10-CM | POA: Diagnosis not present

## 2019-11-12 DIAGNOSIS — Z23 Encounter for immunization: Secondary | ICD-10-CM | POA: Diagnosis not present

## 2019-11-12 DIAGNOSIS — M329 Systemic lupus erythematosus, unspecified: Secondary | ICD-10-CM | POA: Diagnosis not present

## 2019-11-12 DIAGNOSIS — M1990 Unspecified osteoarthritis, unspecified site: Secondary | ICD-10-CM | POA: Diagnosis not present

## 2019-11-12 DIAGNOSIS — K5190 Ulcerative colitis, unspecified, without complications: Secondary | ICD-10-CM | POA: Diagnosis not present

## 2019-11-14 ENCOUNTER — Other Ambulatory Visit: Payer: Self-pay | Admitting: Family Medicine

## 2019-11-14 DIAGNOSIS — Z1231 Encounter for screening mammogram for malignant neoplasm of breast: Secondary | ICD-10-CM

## 2019-11-18 DIAGNOSIS — Z8719 Personal history of other diseases of the digestive system: Secondary | ICD-10-CM | POA: Diagnosis not present

## 2019-11-18 DIAGNOSIS — K5900 Constipation, unspecified: Secondary | ICD-10-CM | POA: Diagnosis not present

## 2019-11-21 ENCOUNTER — Encounter: Payer: BC Managed Care – PPO | Admitting: Obstetrics and Gynecology

## 2019-11-21 ENCOUNTER — Encounter: Payer: BC Managed Care – PPO | Admitting: Gynecology

## 2019-11-26 ENCOUNTER — Other Ambulatory Visit: Payer: Self-pay

## 2019-11-26 ENCOUNTER — Encounter: Payer: Self-pay | Admitting: Obstetrics and Gynecology

## 2019-11-26 ENCOUNTER — Ambulatory Visit (INDEPENDENT_AMBULATORY_CARE_PROVIDER_SITE_OTHER): Payer: BC Managed Care – PPO | Admitting: Obstetrics and Gynecology

## 2019-11-26 VITALS — BP 130/80 | Ht 65.0 in | Wt 302.0 lb

## 2019-11-26 DIAGNOSIS — Z1272 Encounter for screening for malignant neoplasm of vagina: Secondary | ICD-10-CM

## 2019-11-26 DIAGNOSIS — Z01419 Encounter for gynecological examination (general) (routine) without abnormal findings: Secondary | ICD-10-CM

## 2019-11-26 NOTE — Progress Notes (Signed)
Kristine Hayes 12/26/1970 [id number]  SUBJECTIVE:  49 y.o. G2P0020 female for annual routine gynecologic exam and Pap smear. She has no gynecologic concerns.  Notes some minor hot flashes starting.  Current Outpatient Medications  Medication Sig Dispense Refill  . Ascorbic Acid (VITAMIN C) 500 MG CHEW Chew 500 mg by mouth daily.    Marland Kitchen CALCIUM-MAGNESIUM PO Take 1 tablet by mouth daily.    . Cholecalciferol (VITAMIN D3) 5000 units CAPS Take 5,000 Units by mouth daily.    . fish oil-omega-3 fatty acids 1000 MG capsule Take 1 g by mouth daily.     . hydroxychloroquine (PLAQUENIL) 200 MG tablet Take 200 mg by mouth daily.     Marland Kitchen losartan-hydrochlorothiazide (HYZAAR) 100-12.5 MG tablet Take 0.5 tablets by mouth daily.     Marland Kitchen MESALAMINE PO Take by mouth. 4 tablets daily    . Multiple Vitamin (MULTIVITAMIN) tablet Take 1 tablet by mouth daily.    . valACYclovir (VALTREX) 1000 MG tablet Take 1,000 mg by mouth 2 (two) times daily.    . rifampin (RIFADIN) 300 MG capsule Take 2 capsules (600 mg total) by mouth daily. (Patient not taking: Reported on 11/26/2019) 60 capsule 3   Current Facility-Administered Medications  Medication Dose Route Frequency Provider Last Rate Last Admin  . betamethasone acetate-betamethasone sodium phosphate (CELESTONE) injection 3 mg  3 mg Intramuscular Once Daylene Katayama M, DPM      . betamethasone acetate-betamethasone sodium phosphate (CELESTONE) injection 3 mg  3 mg Intramuscular Once Daylene Katayama M, DPM       Allergies: Celecoxib  Patient's last menstrual period was 04/20/2012.  Past medical history,surgical history, problem list, medications, allergies, family history and social history were all reviewed and documented as reviewed in the EPIC chart.  ROS: Pertinent positives and negatives as reviewed in HPI    OBJECTIVE:  BP 130/80 (Cuff Size: Large)   Ht 5\' 5"  (1.651 m)   Wt (!) 302 lb (137 kg)   LMP 04/20/2012   BMI 50.26 kg/m  The patient appears well,  alert, oriented, in no distress. ENT normal.  Neck supple. No cervical or supraclavicular adenopathy or thyromegaly.  Lungs are clear, good air entry, no wheezes, rhonchi or rales. S1 and S2 normal, no murmurs, regular rate and rhythm.  Abdomen soft without tenderness, guarding, mass or organomegaly.  Neurological is normal, no focal findings.  BREAST EXAM: breasts appear normal, no suspicious masses, no skin or nipple changes or axillary nodes  PELVIC EXAM: VULVA: normal appearing vulva with no masses, tenderness or lesions, VAGINA: normal appearing vagina with normal color and discharge, no lesions, CERVIX: surgically absent, UTERUS: surgically absent, vaginal cuff normal, ADNEXA: no masses, nontender, PAP: Pap smear done today, thin-prep method  Chaperone: [name] present during the examination  ASSESSMENT:  49 y.o. G2P0020 here for annual gynecologic exam  PLAN:   1. Perimenopausal. Prior TAH for leiomyoma.  Having some hot flashes.  Suggest trying black cohosh and/or soy.  If symptoms get worse more bothersome let us know.  Check FSH if symptoms get worse. 2. Pap smear 2018.  No significant history of abnormal Pap smears.  Smear of vaginal cuff is obtained today. 3. Mammogram 12/2018.  Normal breast exam today.  She is reminded to schedule an annual mammogram this year when due. 4. Colonoscopy 12/2018.  She will follow up at the recommended interval. 5. Health maintenance.  No labs today as she normally has these completed elsewhere.  Return annually or sooner, prn.  Joseph Pierini MD 11/26/19

## 2019-11-27 LAB — PAP IG W/ RFLX HPV ASCU

## 2019-12-20 ENCOUNTER — Ambulatory Visit
Admission: RE | Admit: 2019-12-20 | Discharge: 2019-12-20 | Disposition: A | Discharge status: Home / Self Care | Payer: BC Managed Care – PPO | Source: Ambulatory Visit | Attending: Family Medicine | Admitting: Family Medicine

## 2019-12-20 ENCOUNTER — Other Ambulatory Visit: Payer: Self-pay

## 2019-12-20 DIAGNOSIS — Z1231 Encounter for screening mammogram for malignant neoplasm of breast: Secondary | ICD-10-CM | POA: Diagnosis not present

## 2020-02-11 DIAGNOSIS — H35462 Secondary vitreoretinal degeneration, left eye: Secondary | ICD-10-CM | POA: Diagnosis not present

## 2020-02-11 DIAGNOSIS — H43813 Vitreous degeneration, bilateral: Secondary | ICD-10-CM | POA: Diagnosis not present

## 2020-02-11 DIAGNOSIS — H2513 Age-related nuclear cataract, bilateral: Secondary | ICD-10-CM | POA: Diagnosis not present

## 2020-02-11 DIAGNOSIS — Z79899 Other long term (current) drug therapy: Secondary | ICD-10-CM | POA: Diagnosis not present

## 2020-03-20 DIAGNOSIS — I10 Essential (primary) hypertension: Secondary | ICD-10-CM | POA: Diagnosis not present

## 2020-03-20 DIAGNOSIS — Z79899 Other long term (current) drug therapy: Secondary | ICD-10-CM | POA: Diagnosis not present

## 2020-03-20 DIAGNOSIS — K51311 Ulcerative (chronic) rectosigmoiditis with rectal bleeding: Secondary | ICD-10-CM | POA: Diagnosis not present

## 2020-03-20 DIAGNOSIS — Z0000 Encounter for general adult medical examination without abnormal findings: Secondary | ICD-10-CM | POA: Diagnosis not present

## 2020-03-20 DIAGNOSIS — E559 Vitamin D deficiency, unspecified: Secondary | ICD-10-CM | POA: Diagnosis not present

## 2020-04-20 DIAGNOSIS — M1990 Unspecified osteoarthritis, unspecified site: Secondary | ICD-10-CM | POA: Diagnosis not present

## 2020-04-20 DIAGNOSIS — M329 Systemic lupus erythematosus, unspecified: Secondary | ICD-10-CM | POA: Diagnosis not present

## 2020-04-20 DIAGNOSIS — Z79899 Other long term (current) drug therapy: Secondary | ICD-10-CM | POA: Diagnosis not present

## 2020-04-20 DIAGNOSIS — K5190 Ulcerative colitis, unspecified, without complications: Secondary | ICD-10-CM | POA: Diagnosis not present

## 2020-04-23 DIAGNOSIS — G4733 Obstructive sleep apnea (adult) (pediatric): Secondary | ICD-10-CM | POA: Diagnosis not present

## 2020-04-23 DIAGNOSIS — I10 Essential (primary) hypertension: Secondary | ICD-10-CM | POA: Diagnosis not present

## 2020-05-21 ENCOUNTER — Other Ambulatory Visit (HOSPITAL_BASED_OUTPATIENT_CLINIC_OR_DEPARTMENT_OTHER): Payer: Self-pay

## 2020-05-21 DIAGNOSIS — R0681 Apnea, not elsewhere classified: Secondary | ICD-10-CM

## 2020-05-21 DIAGNOSIS — G4710 Hypersomnia, unspecified: Secondary | ICD-10-CM

## 2020-05-21 DIAGNOSIS — R0683 Snoring: Secondary | ICD-10-CM

## 2020-05-21 DIAGNOSIS — G4733 Obstructive sleep apnea (adult) (pediatric): Secondary | ICD-10-CM

## 2020-06-04 DIAGNOSIS — K5190 Ulcerative colitis, unspecified, without complications: Secondary | ICD-10-CM | POA: Diagnosis not present

## 2020-06-22 DIAGNOSIS — Z20822 Contact with and (suspected) exposure to covid-19: Secondary | ICD-10-CM | POA: Diagnosis not present

## 2020-07-28 ENCOUNTER — Encounter (HOSPITAL_BASED_OUTPATIENT_CLINIC_OR_DEPARTMENT_OTHER): Payer: BC Managed Care – PPO | Admitting: Internal Medicine

## 2020-08-04 DIAGNOSIS — Z79899 Other long term (current) drug therapy: Secondary | ICD-10-CM | POA: Diagnosis not present

## 2020-08-04 DIAGNOSIS — H43391 Other vitreous opacities, right eye: Secondary | ICD-10-CM | POA: Diagnosis not present

## 2020-08-04 DIAGNOSIS — H35462 Secondary vitreoretinal degeneration, left eye: Secondary | ICD-10-CM | POA: Diagnosis not present

## 2020-08-04 DIAGNOSIS — H43813 Vitreous degeneration, bilateral: Secondary | ICD-10-CM | POA: Diagnosis not present

## 2020-10-08 ENCOUNTER — Encounter (HOSPITAL_BASED_OUTPATIENT_CLINIC_OR_DEPARTMENT_OTHER): Payer: BC Managed Care – PPO | Admitting: Internal Medicine

## 2020-10-13 DIAGNOSIS — D2371 Other benign neoplasm of skin of right lower limb, including hip: Secondary | ICD-10-CM | POA: Diagnosis not present

## 2020-10-13 DIAGNOSIS — D2372 Other benign neoplasm of skin of left lower limb, including hip: Secondary | ICD-10-CM | POA: Diagnosis not present

## 2020-11-05 ENCOUNTER — Other Ambulatory Visit: Payer: Self-pay | Admitting: Family Medicine

## 2020-11-05 DIAGNOSIS — Z1231 Encounter for screening mammogram for malignant neoplasm of breast: Secondary | ICD-10-CM

## 2020-11-18 ENCOUNTER — Other Ambulatory Visit: Payer: Self-pay | Admitting: Gastroenterology

## 2020-11-18 DIAGNOSIS — K5100 Ulcerative (chronic) pancolitis without complications: Secondary | ICD-10-CM | POA: Diagnosis not present

## 2020-11-18 IMAGING — MG DIGITAL SCREENING BILAT W/ TOMO W/ CAD
8 of 14 series · 8 of 40 positions shown · non-contrast
Comparison: Previous exam(s).

CLINICAL DATA: Screening.

EXAM:
DIGITAL SCREENING BILATERAL MAMMOGRAM WITH TOMO AND CAD

[L MLO synth-2D (1 of 2)]
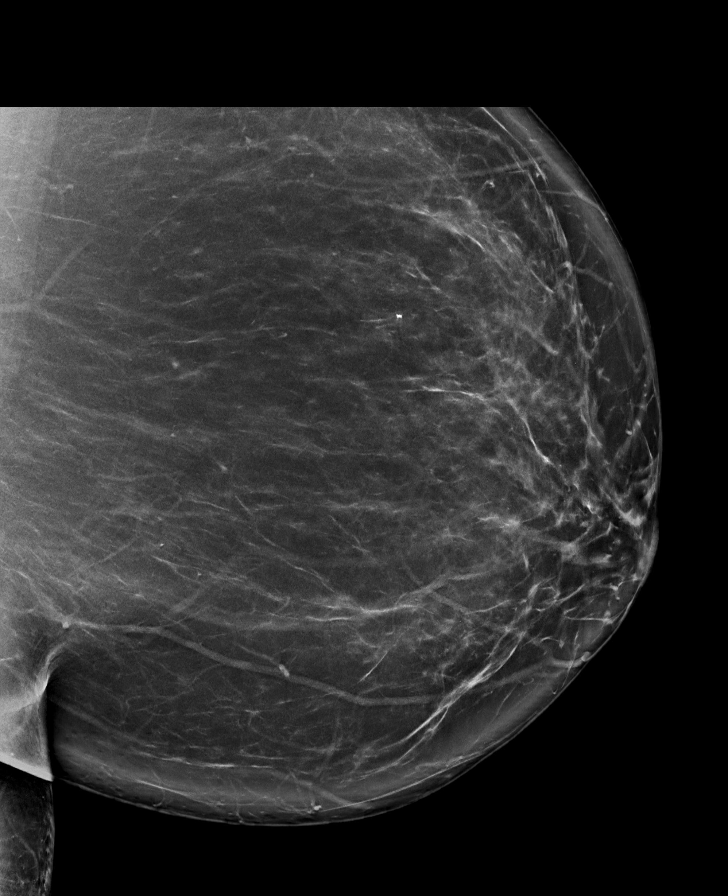

[L CC synth-2D]
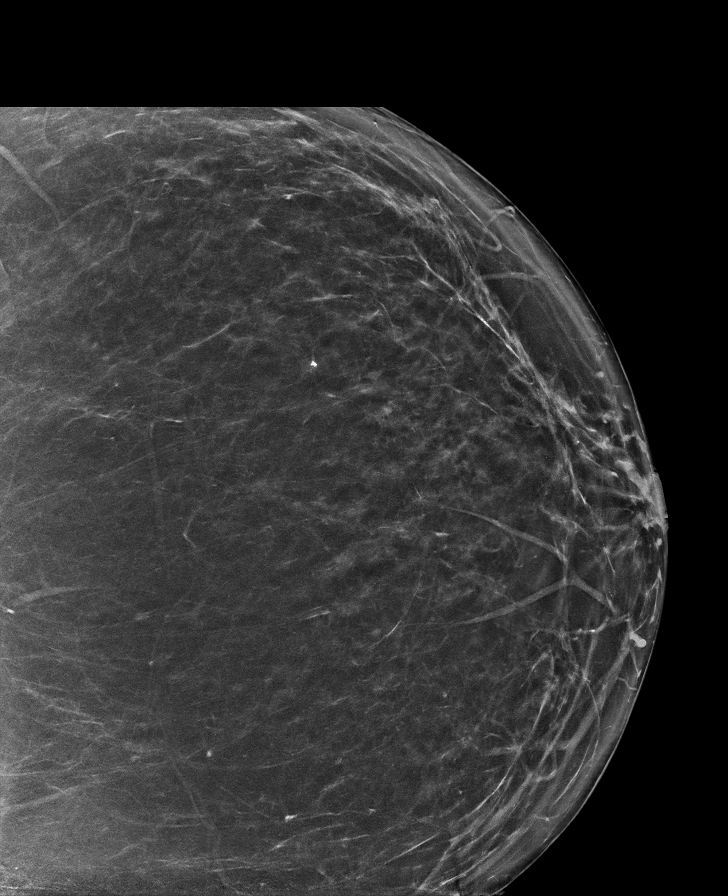

[L MLO synth-2D (2 of 2)]
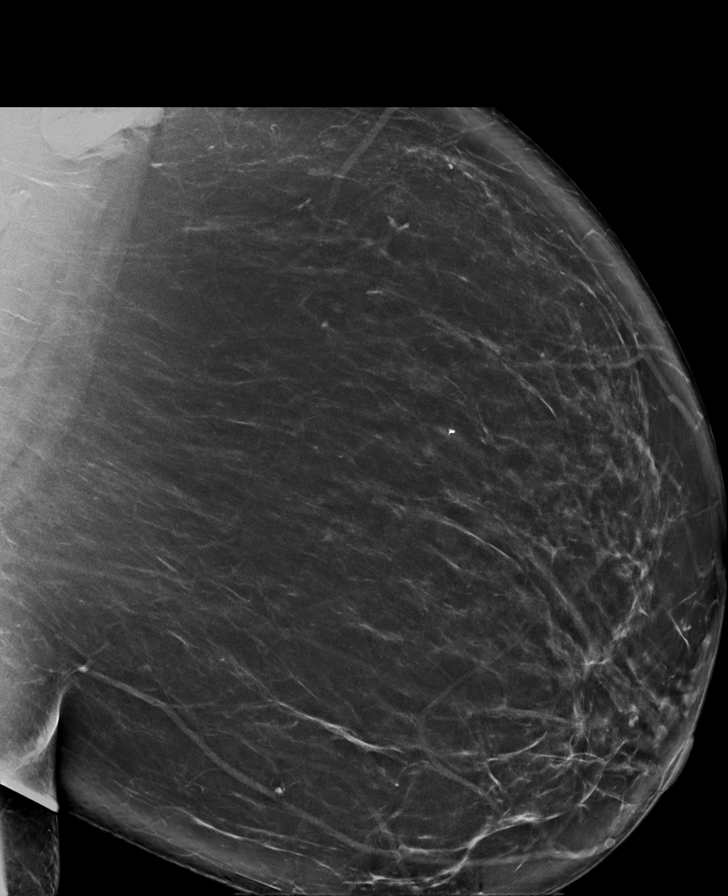

[R CV synth-2D]
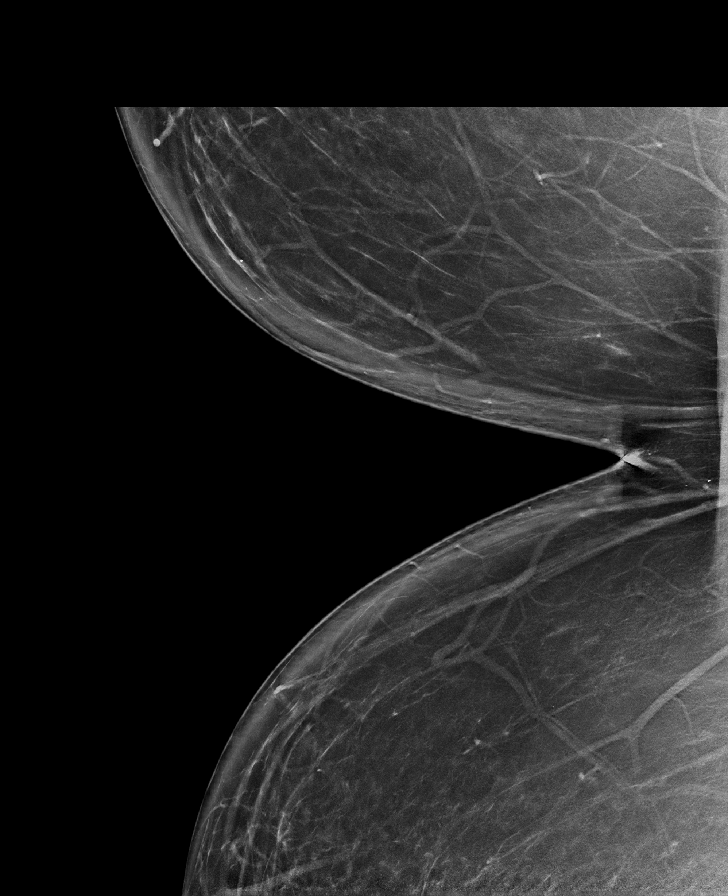

[R MLO synth-2D (1 of 2)]
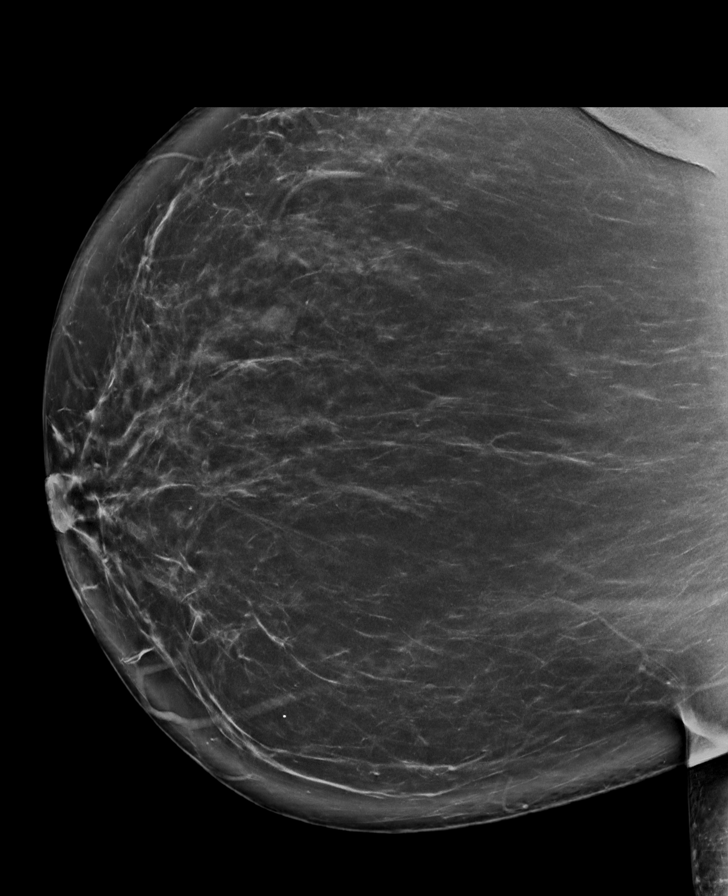

[R CC synth-2D]
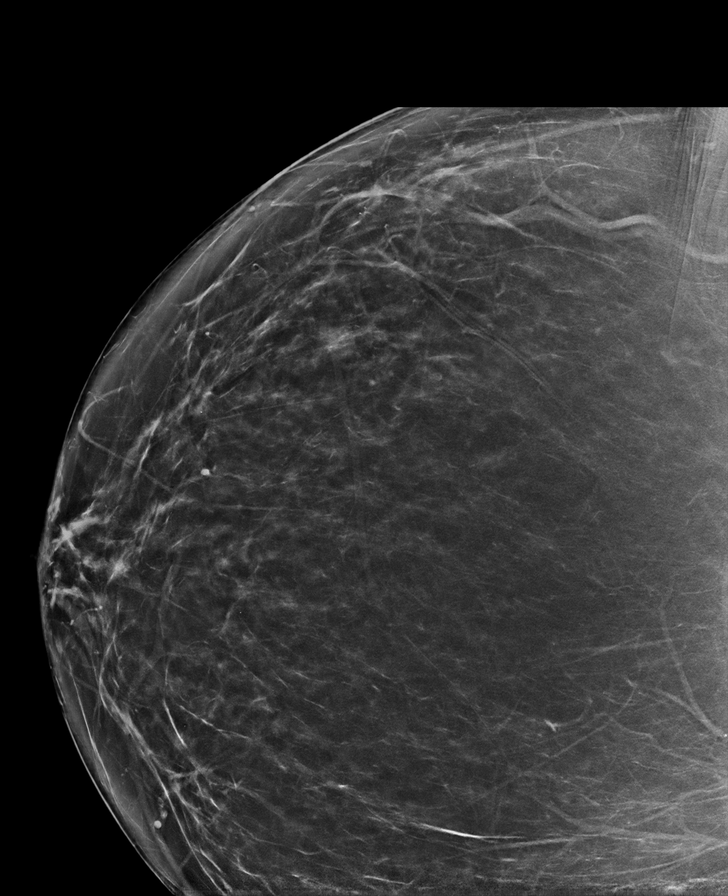

[R MLO synth-2D (2 of 2)]
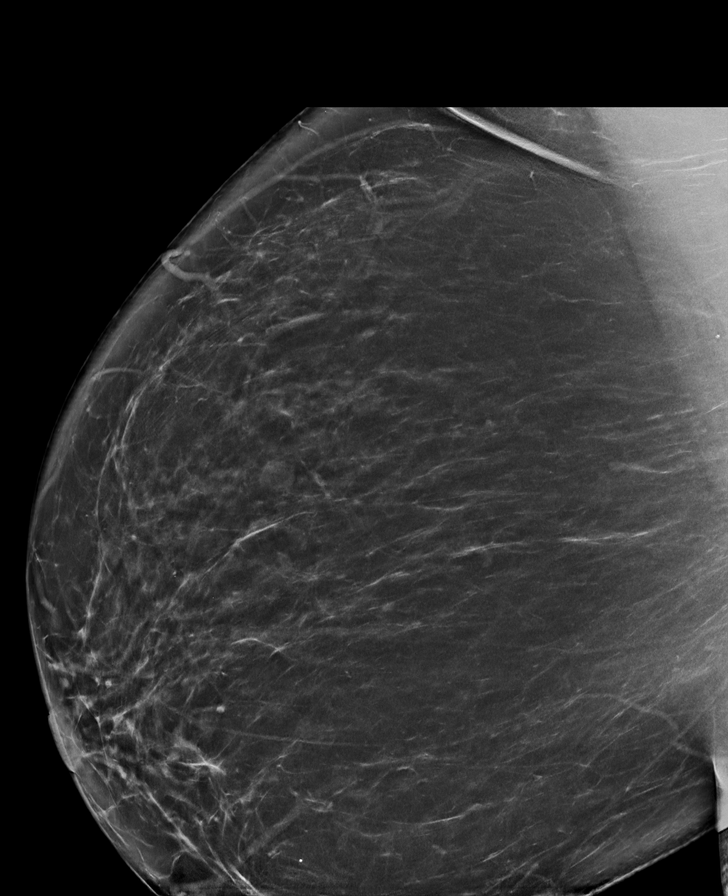

[L MLO tomo · tomo slice 53/105.0]
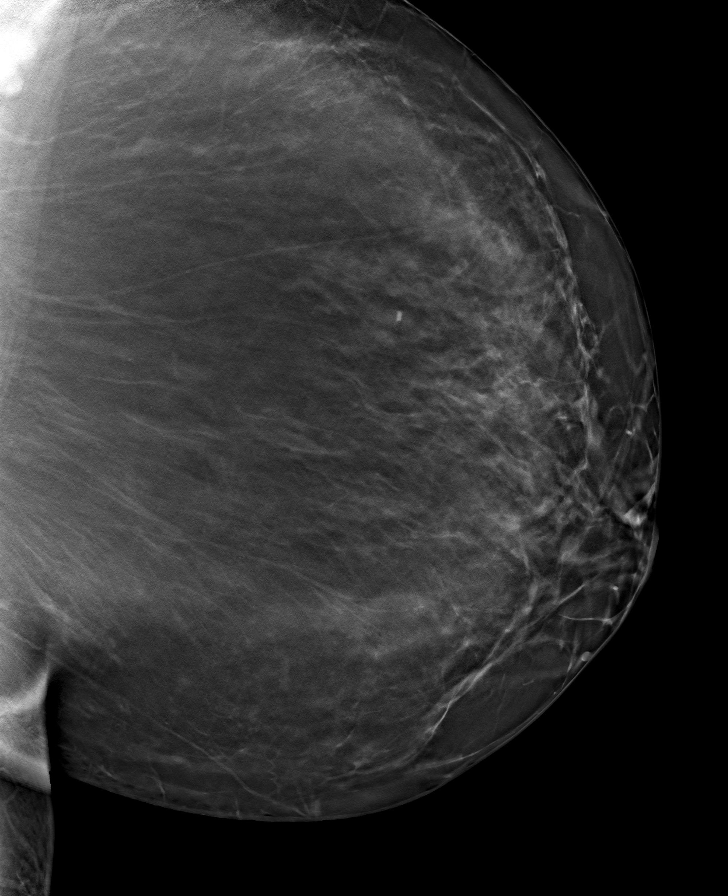

[8 of 40 positions shown; findings below may reference images not displayed]

ACR Breast Density Category b: There are scattered areas of
fibroglandular density.
FINDINGS: Enlarged lymph nodes in the left axilla warrant further evaluation.
In the right breast, no findings suspicious for malignancy.

Images were processed with CAD.
IMPRESSION: Further evaluation is suggested for left axillary lymphadenopathy.

RECOMMENDATION:
Ultrasound of the left axilla.  (Code:[id number])

The patient will be contacted regarding the findings, and additional
imaging will be scheduled.

BI-RADS CATEGORY  0: Incomplete. Need additional imaging evaluation
and/or prior mammograms for comparison.

## 2020-11-26 ENCOUNTER — Ambulatory Visit (INDEPENDENT_AMBULATORY_CARE_PROVIDER_SITE_OTHER): Payer: BC Managed Care – PPO | Admitting: Obstetrics and Gynecology

## 2020-11-26 ENCOUNTER — Ambulatory Visit: Payer: BC Managed Care – PPO | Admitting: Obstetrics and Gynecology

## 2020-11-26 ENCOUNTER — Ambulatory Visit: Payer: BC Managed Care – PPO | Admitting: Nurse Practitioner

## 2020-11-26 ENCOUNTER — Other Ambulatory Visit: Payer: Self-pay

## 2020-11-26 ENCOUNTER — Encounter: Payer: Self-pay | Admitting: Obstetrics and Gynecology

## 2020-11-26 VITALS — BP 140/88 | HR 91 | Ht 65.0 in | Wt 321.0 lb

## 2020-11-26 DIAGNOSIS — Z01419 Encounter for gynecological examination (general) (routine) without abnormal findings: Secondary | ICD-10-CM | POA: Diagnosis not present

## 2020-11-26 DIAGNOSIS — Z6841 Body Mass Index (BMI) 40.0 and over, adult: Secondary | ICD-10-CM

## 2020-11-26 DIAGNOSIS — N762 Acute vulvitis: Secondary | ICD-10-CM

## 2020-11-26 DIAGNOSIS — N393 Stress incontinence (female) (male): Secondary | ICD-10-CM

## 2020-11-26 MED ORDER — NYSTATIN 100000 UNIT/GM EX POWD: 1.0000 "application " | Freq: Three times a day (TID) | CUTANEOUS | 5 refills | Status: AC

## 2020-11-26 NOTE — Progress Notes (Signed)
50 y.o. G7P0020 Single African American female here for annual exam.    Some warm sensation in her face.  Nipple are sensitive.  Not taking ERT.   Weight is going up and down.  She has spoken with a nutritionist.  Interested in help for weight loss.   Having skin irritation from urinary incontinence.  Has stress incontinence. Uses Always pads and not Poise pads, which causes irritation.  PCP:  Jonathon Jordan, MD  Patient's last menstrual period was 04/20/2012.           Sexually active: No.  The current method of family planning is status post hysterectomy.    Exercising: No.  Some walking Smoker:  no  Health Maintenance: Pap:  11-26-19 Neg, 11-14-16 Neg, 11-08-13 Neg History of abnormal Pap:  no MMG:  12-20-19 3D/Neg/Birads1 Colonoscopy:  Appt. 12-29-20 BMD:   n/a  Result  n/a TDaP:  11-11-16 Gardasil:   no HIV:no Hep C:no Screening Labs:  PCP. Flu vaccine tomorrow with rheumatology.    reports that she has never smoked. She has never used smokeless tobacco. She reports that she does not drink alcohol and does not use drugs.  Past Medical History:  Diagnosis Date   Hypertension    BORDERLINE   Lupus ([place])    DX-SKIN BIOPSY-10   Shortness of breath    on exertion due to wt gain per pt   Sleep apnea    mild-no CPAP    Past Surgical History:  Procedure Laterality Date   ABDOMINAL HYSTERECTOMY N/A 07/09/2012   Procedure: HYSTERECTOMY ABDOMINAL;  Surgeon: [name], MD;  Location: [facility];  Service: Gynecology;  Laterality: N/A;   ABDOMINAL SURGERY  07/2002   ABDOMINAL MYOMECTOMY   ESOPHAGOGASTRODUODENOSCOPY (EGD) WITH PROPOFOL N/A 12/28/2015   Procedure: ESOPHAGOGASTRODUODENOSCOPY (EGD) WITH PROPOFOL;  Surgeon: Garlan Fair, MD;  Location: [facility];  Service: Endoscopy;  Laterality: N/A;   FLEXIBLE SIGMOIDOSCOPY N/A 12/28/2015   Procedure: FLEXIBLE SIGMOIDOSCOPY;  Surgeon: Garlan Fair, MD;  Location: [facility];  Service: Endoscopy;   Laterality: N/A;    Current Outpatient Medications  Medication Sig Dispense Refill   Ascorbic Acid (VITAMIN C) 500 MG CHEW Chew 500 mg by mouth daily.     CALCIUM-MAGNESIUM PO Take 1 tablet by mouth daily.     Cholecalciferol (VITAMIN D3) 5000 units CAPS Take 5,000 Units by mouth daily.     Cyanocobalamin (VITAMIN B12) 3000 MCG SUBL See admin instructions.     fish oil-omega-3 fatty acids 1000 MG capsule Take 1 g by mouth daily.      Garlic 9833 MG CAPS 1 capsule     hydroxychloroquine (PLAQUENIL) 200 MG tablet Take 200 mg by mouth daily.      losartan-hydrochlorothiazide (HYZAAR) 100-12.5 MG tablet 1 tablet     mesalamine (APRISO) 0.375 g 24 hr capsule 4 capsules in the morning     Multiple Vitamin (MULTIVITAMIN) tablet Take 1 tablet by mouth daily.     valACYclovir (VALTREX) 1000 MG tablet Take 1,000 mg by mouth 2 (two) times daily.     Wheat Dextrin (BENEFIBER) POWD See admin instructions.     Current Facility-Administered Medications  Medication Dose Route Frequency Provider Last Rate Last Admin   betamethasone acetate-betamethasone sodium phosphate (CELESTONE) injection 3 mg  3 mg Intramuscular Once Daylene Katayama M, DPM       betamethasone acetate-betamethasone sodium phosphate (CELESTONE) injection 3 mg  3 mg Intramuscular Once Edrick Kins, DPM  Family History  Problem Relation Age of Onset   Breast cancer Maternal Aunt        Age unknown    Review of Systems  All other systems reviewed and are negative.  Exam:   BP 140/88   Pulse 91   Ht 5\' 5"  (1.651 m)   Wt (!) 321 lb (145.6 kg)   LMP 04/20/2012   SpO2 100%   BMI 53.42 kg/m     General appearance: alert, cooperative and appears stated age Head: normocephalic, without obvious abnormality, atraumatic Neck: no adenopathy, supple, symmetrical, trachea midline and thyroid normal to inspection and palpation Lungs: clear to auscultation bilaterally Breasts: normal appearance, no masses or tenderness, No nipple  retraction or dimpling, No nipple discharge or bleeding, No axillary adenopathy Heart: regular rate and rhythm Abdomen: soft, non-tender; no masses, no organomegaly Extremities: extremities normal, atraumatic, no cyanosis or edema Skin: skin color, texture, turgor normal. No rashes or lesions Lymph nodes: cervical, supraclavicular, and axillary nodes normal. Neurologic: grossly normal  Pelvic: External genitalia:  right vulva with hypopigmentation.                No abnormal inguinal nodes palpated.              Urethra:  normal appearing urethra with no masses, tenderness or lesions              Bartholins and Skenes: normal                 Vagina: normal appearing vagina with normal color and discharge, no lesions              Cervix: absent              Pap taken: no. Bimanual Exam:  Uterus:  absent              Adnexa: no mass, fullness, tenderness              Rectal exam: yes.  Confirms.              Anus:  normal sphincter tone, no lesions  Chaperone was present for exam:  yes.  Assessment:   Well woman visit with gynecologic exam. Status post TAH.  Hx HSV I. Stress incontinence.  Vulvitis due to pad use and incontinence.  Hx lupus.  BMI 53.4.  Plan: Mammogram screening discussed. Self breast awareness reviewed. Pap not indicated by guidelines.  Guidelines for Calcium, Vitamin D, regular exercise program including cardiovascular and weight bearing exercise. Referral to pelvic floor therapy.  We discussed incontinence products.  Try zinc oxide barrier cream with petroleum jelly.  Nystatin powder.  Referral for medical weight management.  Follow up annually and prn.   After visit summary provided.

## 2020-11-26 NOTE — Patient Instructions (Signed)

## 2020-11-27 ENCOUNTER — Telehealth: Payer: Self-pay | Admitting: Obstetrics and Gynecology

## 2020-11-27 DIAGNOSIS — N393 Stress incontinence (female) (male): Secondary | ICD-10-CM

## 2020-11-27 DIAGNOSIS — M329 Systemic lupus erythematosus, unspecified: Secondary | ICD-10-CM | POA: Diagnosis not present

## 2020-11-27 DIAGNOSIS — Z79899 Other long term (current) drug therapy: Secondary | ICD-10-CM | POA: Diagnosis not present

## 2020-11-27 DIAGNOSIS — K5190 Ulcerative colitis, unspecified, without complications: Secondary | ICD-10-CM | POA: Diagnosis not present

## 2020-11-27 DIAGNOSIS — M1990 Unspecified osteoarthritis, unspecified site: Secondary | ICD-10-CM | POA: Diagnosis not present

## 2020-11-27 NOTE — Telephone Encounter (Signed)
Please place referral for Kristine Hayes for pelvic floor therapy for stress incontinence.

## 2020-11-29 ENCOUNTER — Encounter (HOSPITAL_BASED_OUTPATIENT_CLINIC_OR_DEPARTMENT_OTHER): Payer: BC Managed Care – PPO | Admitting: Internal Medicine

## 2020-12-01 NOTE — Telephone Encounter (Signed)
Referral Placed 

## 2020-12-08 NOTE — Telephone Encounter (Signed)
I called patient, I see [facility] left message for patient to call and no return call from patient. I provided her with # [phone] to call herself to schedule.

## 2020-12-17 ENCOUNTER — Encounter (HOSPITAL_COMMUNITY): Payer: Self-pay | Admitting: Gastroenterology

## 2020-12-17 NOTE — Progress Notes (Signed)
Attempted to obtain medical history via telephone, unable to reach at this time. I left a voicemail to return pre surgical testing department's phone call.  

## 2020-12-21 ENCOUNTER — Ambulatory Visit
Admission: RE | Admit: 2020-12-21 | Discharge: 2020-12-21 | Disposition: A | Discharge status: Home / Self Care | Payer: BC Managed Care – PPO | Source: Ambulatory Visit | Attending: Family Medicine | Admitting: Family Medicine

## 2020-12-21 ENCOUNTER — Other Ambulatory Visit: Payer: Self-pay

## 2020-12-21 DIAGNOSIS — Z1231 Encounter for screening mammogram for malignant neoplasm of breast: Secondary | ICD-10-CM | POA: Diagnosis not present

## 2020-12-28 NOTE — Anesthesia Preprocedure Evaluation (Addendum)
Anesthesia Evaluation  Patient identified by MRN, date of birth, ID band Patient awake    Reviewed: Allergy & Precautions, NPO status , Patient's Chart, lab work & pertinent test results  Airway Mallampati: I  TM Distance: >3 FB Neck ROM: Full    Dental no notable dental hx. (+) Teeth Intact, Dental Advisory Given   Pulmonary sleep apnea ,    Pulmonary exam normal breath sounds clear to auscultation       Cardiovascular hypertension, Pt. on medications Normal cardiovascular exam Rhythm:Regular Rate:Normal     Neuro/Psych negative neurological ROS  negative psych ROS   GI/Hepatic Neg liver ROS, PUD, UC   Endo/Other  Morbid obesity (BMI 50)SLE  Renal/GU negative Renal ROS  negative genitourinary   Musculoskeletal negative musculoskeletal ROS (+)   Abdominal   Peds  Hematology negative hematology ROS (+)   Anesthesia Other Findings   Reproductive/Obstetrics                            Anesthesia Physical Anesthesia Plan  ASA: 3  Anesthesia Plan: MAC   Post-op Pain Management: Minimal or no pain anticipated   Induction: Intravenous  PONV Risk Score and Plan: 2 and Propofol infusion and Treatment may vary due to age or medical condition  Airway Management Planned: Natural Airway  Additional Equipment:   Intra-op Plan:   Post-operative Plan:   Informed Consent: I have reviewed the patients History and Physical, chart, labs and discussed the procedure including the risks, benefits and alternatives for the proposed anesthesia with the patient or authorized representative who has indicated his/her understanding and acceptance.     Dental advisory given  Plan Discussed with: CRNA  Anesthesia Plan Comments:         Anesthesia Quick Evaluation

## 2020-12-28 NOTE — H&P (Signed)
History of Present Illness  General:  50 year old female with history of ulcerative colitis, diagnosed 15 years ago last seen in 5/22, recommended to continue mesalamine 0.375 GM 4 pills daily last colonoscopy from 12/20: focal minimally active colitis in transverse, descending, sigmoid and rectum labs from 06/04/20 showed normal CBC, normal CMP, ESR 9, CRP to, normal vitamin B12 She has gained 8 lbs since her last visit. She denies noticing blood in stool, occasionally stool smells like charcoal, denies straining, she normally has a BM 2-3 times a day, if she drinks a lot of water my have 4 Bms a day, denies diarrhea(loose stools). She takes benefiber as needed, she was taking it mre frequently in the past, but if she maintains her water intake, she doesn't need the fiber supplement as much. Denies nocturnal diarrhea. She tries to drink at least 5 water bottles(tries to increase it to 8-10 bottles a day),8 oz each. Denies nausea, vomiting, acid reflux or heartburn, denies difficulty or pain on swallowing, denies early satiety or bloating(only if she eats a lot of bread). Denies oral ulcers, she has bilateral knee pain and elbows related to lupus and worsens with colder temperature, denies skin rashes.   Current Medications  Taking   Losartan Potassium-HCTZ 100-12.5 MG Tablet 1 tablet Orally Once a day  Calcium 600 MG Tablet 1 tablet with meals Orally once a day  Garlic 7741 MG Capsule 1 capsule Orally once a day  Vitamin B12 3000 MCG Tablet Sublingual as directed Sublingual Once a day  Benefiber(Wheat Dextrin) - Powder as directed Orally   Mesalamine ER 0.375 GM Capsule Extended Release 24 Hour 4 capsules in the morning Orally Once a day  Tylenol(APAP) 325 MG Tablet 2 tabs Orally as needed  Multivitamins Tablet 1 tablet Orally once a day  Omega-3 Fish Oil 1170 mg Capsule 1 tablet Orally once a day, Notes: UNSURE OF DOSAGE  Plaquenil(Hydroxychloroquine Sulfate) 200 MG Tablet 1 tablet Orally  once a day  Vitamin C 500 MG Tablet Chewable 1 tablet Orally Once a day  Vitamin D 3 5000 IU soft gel 1 pill orally once a day  Valtrex(valACYclovir HCl) 1 GM Tablet 1 tablet Orally every 12 hrs. x 2 doses as needed, Notes: PRN per pt havn't used for years  Medication List reviewed and reconciled with the patient   Past Medical History  Hypertension.   headache ( [facility]).   HSV.   mild OSA.   Vit D Deficiency, normal 11/2008.   Systemic lupus: joint swelling and pain, erythema nodosum, discoid rash on scalp, +ANA 1:320, RNP+, dsDNA+ > Dr. Dossie Der > [place].   Chronic active UC, 12/2009, Dr. Earle Gell.   GYN: Dr. Phineas Real.   anemia, iron deficiency due to menometrorrhagia, fibroids, s/p TAH 06/2012, Dr. Phineas Real.   fibroids s/p TAH.   blood type A+.   Chiropractor.   plantar fasciitis/heel spurs, Podiatry.   Fatigue (postponed sleep study 01/2017).   Vein Specialist, Dr. Jones Skene.   Lupus.    Surgical History  myomectomy fibroid 07/04  Hysterectomy 07/09/2012  colonoscopy 01/17/2019   Family History  Father: deceased 89 yrs, Died from accident; (struck by Theatre stage manager)  Mother: alive 75 yrs, elevated cholesterol  Brother 1: alive, unsure of medical hx  Sister 1: alive, unsure of medical hx  Sister 2: alive, unsure of medical hx  Sister 3: alive, unsure of medical hx  Paternal Grand Mother: diagnosed with Diabetes  Paternal uncle: diagnosed with Diabetes  Paternal aunt: diagnosed with Breast cancer  Sister 4: unknown  1 brother(s) , 4 sister(s) .   4 sisters, no autoimmune disease, No Family History of Colon Cancer, Polyps, or Liver/kidney Disease #4 sister Alive, unsure of medical hx 03/20/2020.   Social History  General:  Tobacco use  cigarettes: Never smoked Tobacco history last updated 11/18/2020 Vaping No no EXPOSURE TO PASSIVE SMOKE, never.  no Alcohol, no.  Caffeine: yes, unsweetened /Half tea 1-2 cups occasionally.  no Recreational drug use.  DIET:  good, Trying Mediterranean diet.  Exercise: yes, Tries to walk a little, does cardio (difficult due to bone issues).  Marital Status: single, Divorced.  Children: none.  OCCUPATION: employed, Development worker, community.  Seat belt use: yes.  no Smoking.    Allergies  Celebrex: throat tightness - Side Effects   Hospitalization/Major Diagnostic Procedure  Not within last yr, no recent ER visits 11/2020   Review of Systems  GI PROCEDURE:  Pacemaker/ AICD no. Artificial heart valves no. MI/heart attack no. Abnormal heart rhythm no. Angina no. CVA no. Hypertension YES. Hypotension no. Asthma, COPD no. Sleep apnea no. Seizure disorders no. Artificial joints no. Severe DJD no. Diabetes no. Significant headaches no. Vertigo no. Depression/anxiety n   Vital Signs  Wt 321.4, Wt change 8.6 lbs, Ht 66, BMI 51.87, Temp 97.4, Pulse sitting 80, BP sitting 136/89 2nd BP reading 145/89.   Examination  Gastroenterology:: GENERAL APPEARANCE: Well developed, morbidly obese, no active distress, pleasant.  SCLERA: anicteric.  CARDIOVASCULAR Normal RRR .  RESPIRATORY Breath sounds normal. Respiration even and unlabored.  ABDOMEN No masses palpated. Liver and spleen not palpated, normal. Bowel sounds normal, Abdomen not distended.  EXTREMITIES: No edema.  NEURO: alert, oriented to time, place and person, normal gait.  PSYCH: mood/affect normal.     Assessments     1. Ulcerative pancolitis without complication - H60.73 (Primary)   Treatment  1. Ulcerative pancolitis without complication  LAB: CBC without Diff LAB: Comp Metabolic Panel LAB: Sed Rate (ESR) LAB: CRP, Quant ([id number]) IMAGING: Colonoscopy    Weston Brass 11/18/2020 03:02:11 PM > Pt scheduled at [place] 12/13 - gave instructions, rx, and consents. Delilah Shan #[id number]   Notes: She is doing well clinically, continues the medications. Recommend colonoscopy for dysplasia surveillance. Due to her BMI, colonoscopy will need to be performed as an  outpatient at [facility]. The risks and benefits of the procedure were discussed with the patient in details. She understands and verbalizes consent.

## 2020-12-29 ENCOUNTER — Encounter (HOSPITAL_COMMUNITY): Payer: Self-pay | Admitting: Gastroenterology

## 2020-12-29 ENCOUNTER — Ambulatory Visit (HOSPITAL_COMMUNITY): Payer: BC Managed Care – PPO | Admitting: Anesthesiology

## 2020-12-29 ENCOUNTER — Other Ambulatory Visit: Payer: Self-pay

## 2020-12-29 ENCOUNTER — Encounter (HOSPITAL_COMMUNITY)
Admission: RE | Disposition: A | Discharge status: Home / Self Care | Payer: Self-pay | Source: Home / Self Care | Attending: Gastroenterology

## 2020-12-29 ENCOUNTER — Ambulatory Visit (HOSPITAL_COMMUNITY)
Admission: RE | Admit: 2020-12-29 | Discharge: 2020-12-29 | Disposition: A | Discharge status: Home / Self Care | Payer: BC Managed Care – PPO | Attending: Gastroenterology | Admitting: Gastroenterology

## 2020-12-29 DIAGNOSIS — G4730 Sleep apnea, unspecified: Secondary | ICD-10-CM | POA: Diagnosis not present

## 2020-12-29 DIAGNOSIS — K621 Rectal polyp: Secondary | ICD-10-CM | POA: Diagnosis not present

## 2020-12-29 DIAGNOSIS — Z79899 Other long term (current) drug therapy: Secondary | ICD-10-CM | POA: Insufficient documentation

## 2020-12-29 DIAGNOSIS — Z6841 Body Mass Index (BMI) 40.0 and over, adult: Secondary | ICD-10-CM | POA: Insufficient documentation

## 2020-12-29 DIAGNOSIS — I10 Essential (primary) hypertension: Secondary | ICD-10-CM | POA: Insufficient documentation

## 2020-12-29 DIAGNOSIS — K5909 Other constipation: Secondary | ICD-10-CM | POA: Diagnosis not present

## 2020-12-29 DIAGNOSIS — K6289 Other specified diseases of anus and rectum: Secondary | ICD-10-CM | POA: Diagnosis not present

## 2020-12-29 DIAGNOSIS — K5190 Ulcerative colitis, unspecified, without complications: Secondary | ICD-10-CM | POA: Diagnosis not present

## 2020-12-29 DIAGNOSIS — K5180 Other ulcerative colitis without complications: Secondary | ICD-10-CM | POA: Diagnosis not present

## 2020-12-29 DIAGNOSIS — D128 Benign neoplasm of rectum: Secondary | ICD-10-CM | POA: Diagnosis not present

## 2020-12-29 DIAGNOSIS — K5100 Ulcerative (chronic) pancolitis without complications: Secondary | ICD-10-CM | POA: Diagnosis not present

## 2020-12-29 HISTORY — PX: POLYPECTOMY: SHX5525

## 2020-12-29 HISTORY — PX: COLONOSCOPY WITH PROPOFOL: SHX5780

## 2020-12-29 HISTORY — PX: BIOPSY: SHX5522

## 2020-12-29 SURGERY — COLONOSCOPY WITH PROPOFOL
Anesthesia: Monitor Anesthesia Care

## 2020-12-29 MED ORDER — PROPOFOL 500 MG/50ML IV EMUL
INTRAVENOUS | Status: AC
  Filled 2020-12-29: qty 50

## 2020-12-29 MED ORDER — PROPOFOL 10 MG/ML IV BOLUS
INTRAVENOUS | Status: DC | PRN
  Administered 2020-12-29 (×3): 20 mg via INTRAVENOUS

## 2020-12-29 MED ORDER — PROPOFOL 500 MG/50ML IV EMUL
INTRAVENOUS | Status: DC | PRN
  Administered 2020-12-29: 100 ug/kg/min via INTRAVENOUS

## 2020-12-29 MED ORDER — LACTATED RINGERS IV SOLN: INTRAVENOUS | Status: DC

## 2020-12-29 MED ORDER — PROPOFOL 10 MG/ML IV BOLUS
INTRAVENOUS | Status: AC
  Filled 2020-12-29: qty 20

## 2020-12-29 MED ORDER — SODIUM CHLORIDE 0.9 % IV SOLN: INTRAVENOUS | Status: DC

## 2020-12-29 MED ORDER — LIDOCAINE 2% (20 MG/ML) 5 ML SYRINGE
INTRAMUSCULAR | Status: DC | PRN
  Administered 2020-12-29: 100 mg via INTRAVENOUS

## 2020-12-29 SURGICAL SUPPLY — 22 items

## 2020-12-29 NOTE — Anesthesia Procedure Notes (Signed)
Date/Time: 12/29/2020 8:03 AM Performed by: Sharlette Dense, CRNA Oxygen Delivery Method: Simple face mask

## 2020-12-29 NOTE — Discharge Instructions (Signed)
YOU HAD AN ENDOSCOPIC PROCEDURE TODAY: Refer to the procedure report and other information in the discharge instructions given to you for any specific questions about what was found during the examination. If this information does not answer your questions, please call [facility] office at [phone] to clarify.   YOU SHOULD EXPECT: Some feelings of bloating in the abdomen. Passage of more gas than usual. Walking can help get rid of the air that was put into your GI tract during the procedure and reduce the bloating. If you had a lower endoscopy (such as a colonoscopy or flexible sigmoidoscopy) you may notice spotting of blood in your stool or on the toilet paper. Some abdominal soreness may be present for a day or two, also.  DIET: Your first meal following the procedure should be a light meal and then it is ok to progress to your normal diet. A half-sandwich or bowl of soup is an example of a good first meal. Heavy or fried foods are harder to digest and may make you feel nauseous or bloated. Drink plenty of fluids but you should avoid alcoholic beverages for 24 hours. If you had a esophageal dilation, please see attached instructions for diet.    ACTIVITY: Your care partner should take you home directly after the procedure. You should plan to take it easy, moving slowly for the rest of the day. You can resume normal activity the day after the procedure however YOU SHOULD NOT DRIVE, use power tools, machinery or perform tasks that involve climbing or major physical exertion for 24 hours (because of the sedation medicines used during the test).   SYMPTOMS TO REPORT IMMEDIATELY: A gastroenterologist can be reached at any hour. Please call [phone]  for any of the following symptoms:  Following lower endoscopy (colonoscopy, flexible sigmoidoscopy) Excessive amounts of blood in the stool  Significant tenderness, worsening of abdominal pains  Swelling of the abdomen that is new, acute  Fever of 100  or higher  Following upper endoscopy (EGD, EUS, ERCP, esophageal dilation) Vomiting of blood or coffee ground material  New, significant abdominal pain  New, significant chest pain or pain under the shoulder blades  Painful or persistently difficult swallowing  New shortness of breath  Black, tarry-looking or red, bloody stools  FOLLOW UP:  If any biopsies were taken you will be contacted by phone or by letter within the next 1-3 weeks. Call [phone]  if you have not heard about the biopsies in 3 weeks.  Please also call with any specific questions about appointments or follow up tests. YOU HAD AN ENDOSCOPIC PROCEDURE TODAY: Refer to the procedure report and other information in the discharge instructions given to you for any specific questions about what was found during the examination. If this information does not answer your questions, please call [facility] office at [phone] to clarify.   YOU SHOULD EXPECT: Some feelings of bloating in the abdomen. Passage of more gas than usual. Walking can help get rid of the air that was put into your GI tract during the procedure and reduce the bloating. If you had a lower endoscopy (such as a colonoscopy or flexible sigmoidoscopy) you may notice spotting of blood in your stool or on the toilet paper. Some abdominal soreness may be present for a day or two, also.  DIET: Your first meal following the procedure should be a light meal and then it is ok to progress to your normal diet. A half-sandwich or bowl of soup is an   example of a good first meal. Heavy or fried foods are harder to digest and may make you feel nauseous or bloated. Drink plenty of fluids but you should avoid alcoholic beverages for 24 hours. If you had a esophageal dilation, please see attached instructions for diet.    ACTIVITY: Your care partner should take you home directly after the procedure. You should plan to take it easy, moving slowly for the rest of the day. You can resume  normal activity the day after the procedure however YOU SHOULD NOT DRIVE, use power tools, machinery or perform tasks that involve climbing or major physical exertion for 24 hours (because of the sedation medicines used during the test).   SYMPTOMS TO REPORT IMMEDIATELY: A gastroenterologist can be reached at any hour. Please call [phone]  for any of the following symptoms:  Following lower endoscopy (colonoscopy, flexible sigmoidoscopy) Excessive amounts of blood in the stool  Significant tenderness, worsening of abdominal pains  Swelling of the abdomen that is new, acute  Fever of 100 or higher    FOLLOW UP:  If any biopsies were taken you will be contacted by phone or by letter within the next 1-3 weeks. Call [phone]  if you have not heard about the biopsies in 3 weeks.  Please also call with any specific questions about appointments or follow up tests. 

## 2020-12-29 NOTE — Anesthesia Postprocedure Evaluation (Signed)
Anesthesia Post Note  Patient: Kristine Hayes  Procedure(s) Performed: COLONOSCOPY WITH PROPOFOL BIOPSY POLYPECTOMY     Patient location during evaluation: Endoscopy Anesthesia Type: MAC Level of consciousness: awake and alert Pain management: pain level controlled Vital Signs Assessment: post-procedure vital signs reviewed and stable Respiratory status: spontaneous breathing, nonlabored ventilation, respiratory function stable and patient connected to nasal cannula oxygen Cardiovascular status: blood pressure returned to baseline and stable Postop Assessment: no apparent nausea or vomiting Anesthetic complications: no   No notable events documented.  Last Vitals:  Vitals:   12/29/20 0840 12/29/20 0850  BP: (!) 142/90 (!) 152/90  Pulse: 76   Resp: 17 18  Temp:    SpO2: 100% 99%    Last Pain:  Vitals:   12/29/20 0850  TempSrc:   PainSc: 0-No pain                 Nathanel Tallman L Sienna Stonehocker

## 2020-12-29 NOTE — Interval H&P Note (Signed)
History and Physical Interval Note: 50/female with UC for over 15 years, last colon in 2020, for a surveillance colonoscopy.  12/29/2020 7:58 AM  Kristine Hayes  has presented today for colonoscopy with propofol, with the diagnosis of Ulcerative pancolitis.  The various methods of treatment have been discussed with the patient and family. After consideration of risks, benefits and other options for treatment, the patient has consented to  Procedure(s): COLONOSCOPY WITH PROPOFOL (N/A) as a surgical intervention.  The patient's history has been reviewed, patient examined, no change in status, stable for surgery.  I have reviewed the patient's chart and labs.  Questions were answered to the patient's satisfaction.     [name]

## 2020-12-29 NOTE — Transfer of Care (Signed)
Immediate Anesthesia Transfer of Care Note  Patient: Kristine Hayes  Procedure(s) Performed: COLONOSCOPY WITH PROPOFOL BIOPSY POLYPECTOMY  Patient Location: Endoscopy Unit  Anesthesia Type:MAC  Level of Consciousness: awake, alert  and oriented  Airway & Oxygen Therapy: Patient Spontanous Breathing  Post-op Assessment: Report given to RN and Post -op Vital signs reviewed and stable  Post vital signs: Reviewed and stable  Last Vitals:  Vitals Value Taken Time  BP    Temp    Pulse 84 12/29/20 0828  Resp 19 12/29/20 0828  SpO2 100 % 12/29/20 0828  Vitals shown include unvalidated device data.  Last Pain:  Vitals:   12/29/20 0719  TempSrc: Oral  PainSc: 0-No pain         Complications: No notable events documented.

## 2020-12-29 NOTE — Op Note (Signed)
[facility] Patient Name: Kristine Hayes Procedure Date: 12/29/2020 MRN: [id number] Attending MD: [name] , MD Date of Birth: 03/29/1970 CSN: [id number] Age: 50 Admit Type: Outpatient Procedure:                Colonoscopy Indications:              Last colonoscopy: 2020, Follow-up of chronic                            ulcerative pancolitis(dignosed 15 years ago,                            currently on oral mesalamine) Providers:                [name], MD, Burtis Junes, RN, Luan Moore,                            Technician, Danley Danker, CRNA Referring MD:             Candiss Norse Medicines:                Monitored Anesthesia Care Complications:            No immediate complications. Estimated blood loss:                            Minimal. Estimated Blood Loss:     Estimated blood loss was minimal. Procedure:                Pre-Anesthesia Assessment:                           - Prior to the procedure, a History and Physical                            was performed, and patient medications and                            allergies were reviewed. The patient's tolerance of                            previous anesthesia was also reviewed. The risks                            and benefits of the procedure and the sedation                            options and risks were discussed with the patient.                            All questions were answered, and informed consent                            was obtained. Prior Anticoagulants: The patient has                            taken no previous  anticoagulant or antiplatelet                            agents. ASA Grade Assessment: III - A patient with                            severe systemic disease. After reviewing the risks                            and benefits, the patient was deemed in                            satisfactory condition to undergo the procedure.                           After  obtaining informed consent, the colonoscope                            was passed under direct vision. Throughout the                            procedure, the patient's blood pressure, pulse, and                            oxygen saturations were monitored continuously. The                            [id number]-HQ190L ([id number]) Olympus colonoscope was                            introduced through the anus and advanced to the the                            terminal ileum. The colonoscopy was performed                            without difficulty. The patient tolerated the                            procedure well. The quality of the bowel                            preparation was excellent. Scope In: 8:06:38 AM Scope Out: 8:23:09 AM Scope Withdrawal Time: 0 hours 14 minutes 9 seconds  Total Procedure Duration: 0 hours 16 minutes 31 seconds  Findings:      The perianal and digital rectal examinations were normal.      The terminal ileum appeared normal.      Normal mucosa was found at the hepatic flexure, in the ascending colon       and in the cecum. One biopsy was taken every 10 cm with a cold forceps       for ulcerative colitis surveillance. These biopsy specimens were sent to       Pathology.      Normal mucosa was found in the transverse colon. One biopsy  was taken       every 10 cm with a cold forceps from the transverse colon for ulcerative       colitis surveillance. These biopsy specimens were sent to Pathology.      Normal mucosa was found in the sigmoid colon and in the descending       colon. One biopsy was taken every 10 cm with a cold forceps for       ulcerative colitis surveillance. These biopsy specimens were sent to       Pathology.      Normal mucosa was found in the rectum. One biopsy was taken every 10 cm       with a cold forceps for ulcerative colitis surveillance. These biopsy       specimens were sent to Pathology.      A 5 mm polyp was found in the rectum. The  polyp was sessile. The polyp       was removed with a piecemeal technique using a cold biopsy forceps.       Resection and retrieval were complete.      The exam was otherwise without abnormality on direct and retroflexion       views. Impression:               - The examined portion of the ileum was normal.                           - Normal mucosa at the hepatic flexure, in the                            ascending colon and in the cecum. Biopsied.                           - Normal mucosa in the transverse colon. Biopsied.                           - Normal mucosa in the sigmoid colon and in the                            descending colon. Biopsied.                           - Normal mucosa in the rectum. Biopsied.                           - One 5 mm polyp in the rectum, removed piecemeal                            using a cold biopsy forceps. Resected and retrieved.                           - The examination was otherwise normal on direct                            and retroflexion views. Moderate Sedation:      Patient did not receive moderate sedation for this procedure, but       instead received monitored anesthesia care.  Recommendation:           - Patient has a contact number available for                            emergencies. The signs and symptoms of potential                            delayed complications were discussed with the                            patient. Return to normal activities tomorrow.                            Written discharge instructions were provided to the                            patient.                           - Resume regular diet.                           - Continue present medications.                           - Await pathology results.                           - Repeat colonoscopy in 2 years for surveillance. Procedure Code(s):        --- Professional ---                           [id number], Colonoscopy, flexible; with biopsy, single                             or multiple Diagnosis Code(s):        --- Professional ---                           K62.1, Rectal polyp                           K51.00, Ulcerative (chronic) pancolitis without                            complications CPT copyright 2019 [employer]. All rights reserved. The codes documented in this report are preliminary and upon coder review may  be revised to meet current compliance requirements. [name], MD 12/29/2020 8:30:33 AM This report has been signed electronically. Number of Addenda: 0

## 2020-12-30 ENCOUNTER — Encounter (HOSPITAL_COMMUNITY): Payer: Self-pay | Admitting: Gastroenterology

## 2020-12-30 LAB — SURGICAL PATHOLOGY

## 2021-01-06 ENCOUNTER — Encounter (HOSPITAL_BASED_OUTPATIENT_CLINIC_OR_DEPARTMENT_OTHER): Payer: BC Managed Care – PPO | Admitting: Internal Medicine

## 2021-01-06 DIAGNOSIS — Z20822 Contact with and (suspected) exposure to covid-19: Secondary | ICD-10-CM | POA: Diagnosis not present

## 2021-01-06 DIAGNOSIS — Z03818 Encounter for observation for suspected exposure to other biological agents ruled out: Secondary | ICD-10-CM | POA: Diagnosis not present

## 2021-03-24 DIAGNOSIS — H43391 Other vitreous opacities, right eye: Secondary | ICD-10-CM | POA: Diagnosis not present

## 2021-03-24 DIAGNOSIS — Z79899 Other long term (current) drug therapy: Secondary | ICD-10-CM | POA: Diagnosis not present

## 2021-03-24 DIAGNOSIS — H43813 Vitreous degeneration, bilateral: Secondary | ICD-10-CM | POA: Diagnosis not present

## 2021-03-24 DIAGNOSIS — H43823 Vitreomacular adhesion, bilateral: Secondary | ICD-10-CM | POA: Diagnosis not present

## 2021-04-19 DIAGNOSIS — K51311 Ulcerative (chronic) rectosigmoiditis with rectal bleeding: Secondary | ICD-10-CM | POA: Diagnosis not present

## 2021-04-19 DIAGNOSIS — Z0000 Encounter for general adult medical examination without abnormal findings: Secondary | ICD-10-CM | POA: Diagnosis not present

## 2021-04-19 DIAGNOSIS — Z79899 Other long term (current) drug therapy: Secondary | ICD-10-CM | POA: Diagnosis not present

## 2021-04-19 DIAGNOSIS — I10 Essential (primary) hypertension: Secondary | ICD-10-CM | POA: Diagnosis not present

## 2021-04-19 DIAGNOSIS — E559 Vitamin D deficiency, unspecified: Secondary | ICD-10-CM | POA: Diagnosis not present

## 2021-06-09 DIAGNOSIS — K5190 Ulcerative colitis, unspecified, without complications: Secondary | ICD-10-CM | POA: Diagnosis not present

## 2021-06-09 DIAGNOSIS — M1990 Unspecified osteoarthritis, unspecified site: Secondary | ICD-10-CM | POA: Diagnosis not present

## 2021-06-09 DIAGNOSIS — M329 Systemic lupus erythematosus, unspecified: Secondary | ICD-10-CM | POA: Diagnosis not present

## 2021-06-09 DIAGNOSIS — Z79899 Other long term (current) drug therapy: Secondary | ICD-10-CM | POA: Diagnosis not present

## 2021-09-29 DIAGNOSIS — H43391 Other vitreous opacities, right eye: Secondary | ICD-10-CM | POA: Diagnosis not present

## 2021-09-29 DIAGNOSIS — Z79899 Other long term (current) drug therapy: Secondary | ICD-10-CM | POA: Diagnosis not present

## 2021-09-29 DIAGNOSIS — H35462 Secondary vitreoretinal degeneration, left eye: Secondary | ICD-10-CM | POA: Diagnosis not present

## 2021-09-29 DIAGNOSIS — H43813 Vitreous degeneration, bilateral: Secondary | ICD-10-CM | POA: Diagnosis not present

## 2021-11-08 ENCOUNTER — Other Ambulatory Visit: Payer: Self-pay | Admitting: Family Medicine

## 2021-11-08 DIAGNOSIS — Z1231 Encounter for screening mammogram for malignant neoplasm of breast: Secondary | ICD-10-CM

## 2021-11-22 DIAGNOSIS — K5100 Ulcerative (chronic) pancolitis without complications: Secondary | ICD-10-CM | POA: Diagnosis not present

## 2021-11-30 ENCOUNTER — Encounter: Payer: Self-pay | Admitting: Obstetrics and Gynecology

## 2021-11-30 ENCOUNTER — Ambulatory Visit (INDEPENDENT_AMBULATORY_CARE_PROVIDER_SITE_OTHER): Payer: BC Managed Care – PPO | Admitting: Obstetrics and Gynecology

## 2021-11-30 VITALS — BP 128/80 | HR 84 | Ht 66.0 in | Wt 322.0 lb

## 2021-11-30 DIAGNOSIS — N763 Subacute and chronic vulvitis: Secondary | ICD-10-CM | POA: Diagnosis not present

## 2021-11-30 DIAGNOSIS — Z1159 Encounter for screening for other viral diseases: Secondary | ICD-10-CM | POA: Diagnosis not present

## 2021-11-30 DIAGNOSIS — Z114 Encounter for screening for human immunodeficiency virus [HIV]: Secondary | ICD-10-CM | POA: Diagnosis not present

## 2021-11-30 DIAGNOSIS — Z01419 Encounter for gynecological examination (general) (routine) without abnormal findings: Secondary | ICD-10-CM | POA: Diagnosis not present

## 2021-11-30 MED ORDER — CLOTRIMAZOLE-BETAMETHASONE 1-0.05 % EX CREA: 1.0000 | TOPICAL_CREAM | Freq: Two times a day (BID) | CUTANEOUS | 0 refills | Status: DC

## 2021-11-30 NOTE — Patient Instructions (Signed)

## 2021-11-30 NOTE — Progress Notes (Signed)
51 y.o. [id number] Single African American female here for annual exam.    Having issues with urinary incontinence.  Has been wearing pads.  She is trying to find one that is comfortable for her and does not cause vulvar irritation.   Doing the Kegel exercises.  She is satisfied with this.   No vaginal discharge.   No prediabetes or diabetes dx.   Some hot flashes.  They come and go. Sleeping ok.  PCP:   Jonathon Jordan, MD Rheumatology:  Dr Jenetta Downer  Patient's last menstrual period was 04/20/2012.           Sexually active: No.  The current method of family planning is status post hysterectomy.    Exercising: No.     Smoker:  no  Health Maintenance: Pap:   11-26-19 Neg, 11-14-16 Neg, 11-08-13 Neg  History of abnormal Pap:  no MMG:  12/21/20 BI-RADS CATEGORY 1 NEG. Has appointment for December.  Colonoscopy:  12/29/20 - mild chronic colitis, hyperplastic polyp - due in 2 years. BMD:  n/a TDaP:  11/11/2016, 05/05/2006 Gardasil:   no HIV:  today Hep C:  today Screening Labs:  PCP.  Flu and Covid vaccines completed.   reports that she has never smoked. She has never used smokeless tobacco. She reports that she does not drink alcohol and does not use drugs.  Past Medical History:  Diagnosis Date   Hypertension    BORDERLINE   Lupus ([place])    DX-SKIN BIOPSY-10   Shortness of breath    on exertion due to wt gain per pt   Sleep apnea    mild-no CPAP    Past Surgical History:  Procedure Laterality Date   ABDOMINAL HYSTERECTOMY N/A 07/09/2012   Procedure: HYSTERECTOMY ABDOMINAL;  Surgeon: [name], MD;  Location: [facility];  Service: Gynecology;  Laterality: N/A;   ABDOMINAL SURGERY  07/2002   ABDOMINAL MYOMECTOMY   BIOPSY  12/29/2020   Procedure: BIOPSY;  Surgeon: [name], MD;  Location: [facility];  Service: Gastroenterology;;   COLONOSCOPY WITH PROPOFOL N/A 12/29/2020   Procedure: COLONOSCOPY WITH PROPOFOL;  Surgeon: [name], MD;  Location: [facility];   Service: Gastroenterology;  Laterality: N/A;   ESOPHAGOGASTRODUODENOSCOPY (EGD) WITH PROPOFOL N/A 12/28/2015   Procedure: ESOPHAGOGASTRODUODENOSCOPY (EGD) WITH PROPOFOL;  Surgeon: Garlan Fair, MD;  Location: [facility];  Service: Endoscopy;  Laterality: N/A;   FLEXIBLE SIGMOIDOSCOPY N/A 12/28/2015   Procedure: FLEXIBLE SIGMOIDOSCOPY;  Surgeon: Garlan Fair, MD;  Location: [facility];  Service: Endoscopy;  Laterality: N/A;   POLYPECTOMY  12/29/2020   Procedure: POLYPECTOMY;  Surgeon: [name], MD;  Location: [facility];  Service: Gastroenterology;;    Current Outpatient Medications  Medication Sig Dispense Refill   Ascorbic Acid (VITAMIN C) 500 MG CHEW Chew 500 mg by mouth daily.     calcium carbonate (OS-CAL - DOSED IN MG OF ELEMENTAL CALCIUM) 1250 (500 Ca) MG tablet Take 1 tablet by mouth daily with breakfast.     Cholecalciferol (VITAMIN D3) 5000 units CAPS Take 5,000 Units by mouth daily.     clotrimazole-betamethasone (LOTRISONE) cream Apply 1 Application topically 2 (two) times daily. Use for 2 weeks at a time. 30 g 0   Cyanocobalamin (VITAMIN B-12) 2500 MCG SUBL Place 2,500 mcg under the tongue daily.     Garlic 0093 MG CAPS Take 1,000 mg by mouth daily.     hydroxychloroquine (PLAQUENIL) 200 MG tablet Take 200 mg by mouth daily.      losartan-hydrochlorothiazide (  HYZAAR) 100-12.5 MG tablet Take 1 tablet by mouth daily.     mesalamine (APRISO) 0.375 g 24 hr capsule 1.5 g daily.     Multiple Vitamin (MULTIVITAMIN) tablet Take 1 tablet by mouth daily.     OMEGA-3 FATTY ACIDS PO Take 1 capsule by mouth daily.     polyethylene glycol-electrolytes (NULYTELY) 420 g solution Take by mouth.     valACYclovir (VALTREX) 1000 MG tablet Take 1,000 mg by mouth 2 (two) times daily as needed (cold sores).     Wheat Dextrin (BENEFIBER) POWD Take 1 packet by mouth daily as needed (constipation).     nystatin (MYCOSTATIN/NYSTOP) powder Apply 1 application topically 3 (three) times  daily. Apply to affected area for up to 7 days (Patient not taking: Reported on 11/30/2021) 15 g 5   No current facility-administered medications for this visit.    Family History  Problem Relation Age of Onset   Breast cancer Maternal Aunt        Age unknown    Review of Systems  All other systems reviewed and are negative.   Exam:   BP 128/80 (BP Location: Left Arm, Patient Position: Sitting, Cuff Size: Large)   Pulse 84   Ht '5\' 6"'$  (1.676 m)   Wt (!) 322 lb (146.1 kg)   LMP 04/20/2012   BMI 51.97 kg/m     General appearance: alert, cooperative and appears stated age Head: normocephalic, without obvious abnormality, atraumatic Neck: no adenopathy, supple, symmetrical, trachea midline and thyroid normal to inspection and palpation Lungs: clear to auscultation bilaterally Breasts: normal appearance, no masses or tenderness, No nipple retraction or dimpling, No nipple discharge or bleeding, No axillary adenopathy Heart: regular rate and rhythm Abdomen: soft, non-tender; no masses, no organomegaly Extremities: extremities normal, atraumatic, no cyanosis or edema Skin: skin color, texture, turgor normal. No rashes or lesions Lymph nodes: cervical, supraclavicular, and axillary nodes normal. Neurologic: grossly normal  Pelvic: External genitalia:  generalize greyish coloration change of the vulva and thickened skin.  No specific lesions.               No abnormal inguinal nodes palpated.              Urethra:  normal appearing urethra with no masses, tenderness or lesions              Bartholins and Skenes: normal                 Vagina: normal appearing vagina with normal color and discharge, no lesions              Cervix: absent              Pap taken: no Bimanual Exam:  Uterus:  absent.              Adnexa: no mass, fullness, tenderness              Rectal exam: yes.  Confirms.              Anus:  normal sphincter tone, no lesions  Chaperone was present for exam:   Santiago Glad.  Assessment:   Well woman visit with gynecologic exam. Status post TAH.  Hx HSV I. Routine HIV and hep C screening.  Stress incontinence.  Chronic vulvitis.  Hx lupus.  BMI 51.97.  Plan: Mammogram screening discussed. Self breast awareness reviewed. Pap and HR HPV next year.  Guidelines for Calcium, Vitamin D, regular exercise program including cardiovascular and  weight bearing exercise. HIV and hep C testing today. Lotrisone cream to the vulva bid x 2 weeks prn.  FU in 3 weeks.  If no improvement, will do vulvar biopsy.  Rationale explained.  Follow up annually and prn.   After visit summary provided.

## 2021-12-01 LAB — HEPATITIS C ANTIBODY: Hepatitis C Ab: NONREACTIVE

## 2021-12-01 LAB — HIV ANTIBODY (ROUTINE TESTING W REFLEX): HIV 1&2 Ab, 4th Generation: NONREACTIVE

## 2021-12-20 NOTE — Progress Notes (Unsigned)
GYNECOLOGY  VISIT   HPI: 51 y.o.   Single  African American  female   [id number] with Patient's last menstrual period was 04/20/2012.   here for   3 week recheck/possible vulvar bx  She has chronic vulvitis.  She received Rx for Lotrisone on 11/30/21.   Cream is helping.  She is looking for a new cotton protective pad to use as needed.  Hx lupus.   GYNECOLOGIC HISTORY: Patient's last menstrual period was 04/20/2012. Contraception:  hysterectomy Menopausal hormone therapy:  n/a Last mammogram:  12/21/20, Breast Density Category B, BI-RADS CATEGORY 1 NEG. Has appointment for December.   Last pap smear:   11-26-19 Neg, 11-14-16 Neg, 11-08-13 Neg          OB History     Gravida  2   Para      Term      Preterm      AB  2   Living  0      SAB  2   IAB      Ectopic      Multiple      Live Births                 Patient Active Problem List   Diagnosis Date Noted   TB lung, latent 05/23/2019   ERYTHEMA NODOSUM 12/15/2008   PAIN IN JOINT, MULTIPLE SITES 12/15/2008   ULCERATIVE PROCTITIS 07/13/2007   NAUSEA 07/13/2007   CONSTIPATION, CHRONIC 07/12/2007    Past Medical History:  Diagnosis Date   Hypertension    BORDERLINE   Lupus ([place])    DX-SKIN BIOPSY-10   Shortness of breath    on exertion due to wt gain per pt   Sleep apnea    mild-no CPAP    Past Surgical History:  Procedure Laterality Date   ABDOMINAL HYSTERECTOMY N/A 07/09/2012   Procedure: HYSTERECTOMY ABDOMINAL;  Surgeon: [name], MD;  Location: [facility];  Service: Gynecology;  Laterality: N/A;   ABDOMINAL SURGERY  07/2002   ABDOMINAL MYOMECTOMY   BIOPSY  12/29/2020   Procedure: BIOPSY;  Surgeon: [name], MD;  Location: [facility];  Service: Gastroenterology;;   COLONOSCOPY WITH PROPOFOL N/A 12/29/2020   Procedure: COLONOSCOPY WITH PROPOFOL;  Surgeon: [name], MD;  Location: [facility];  Service: Gastroenterology;  Laterality: N/A;   ESOPHAGOGASTRODUODENOSCOPY (EGD) WITH  PROPOFOL N/A 12/28/2015   Procedure: ESOPHAGOGASTRODUODENOSCOPY (EGD) WITH PROPOFOL;  Surgeon: Garlan Fair, MD;  Location: [facility];  Service: Endoscopy;  Laterality: N/A;   FLEXIBLE SIGMOIDOSCOPY N/A 12/28/2015   Procedure: FLEXIBLE SIGMOIDOSCOPY;  Surgeon: Garlan Fair, MD;  Location: [facility];  Service: Endoscopy;  Laterality: N/A;   POLYPECTOMY  12/29/2020   Procedure: POLYPECTOMY;  Surgeon: [name], MD;  Location: [facility];  Service: Gastroenterology;;    Current Outpatient Medications  Medication Sig Dispense Refill   Ascorbic Acid (VITAMIN C) 500 MG CHEW Chew 500 mg by mouth daily.     calcium carbonate (OS-CAL - DOSED IN MG OF ELEMENTAL CALCIUM) 1250 (500 Ca) MG tablet Take 1 tablet by mouth daily with breakfast.     Cholecalciferol (VITAMIN D3) 5000 units CAPS Take 5,000 Units by mouth daily.     clotrimazole-betamethasone (LOTRISONE) cream Apply 1 Application topically 2 (two) times daily. Use for 2 weeks at a time. 30 g 0   Cyanocobalamin (VITAMIN B-12) 2500 MCG SUBL Place 2,500 mcg under the tongue daily.     Garlic 5852 MG CAPS Take 1,000 mg by  mouth daily.     hydroxychloroquine (PLAQUENIL) 200 MG tablet Take 200 mg by mouth daily.      losartan-hydrochlorothiazide (HYZAAR) 100-12.5 MG tablet Take 1 tablet by mouth daily.     mesalamine (APRISO) 0.375 g 24 hr capsule 1.5 g daily.     Multiple Vitamin (MULTIVITAMIN) tablet Take 1 tablet by mouth daily.     OMEGA-3 FATTY ACIDS PO Take 1 capsule by mouth daily.     polyethylene glycol-electrolytes (NULYTELY) 420 g solution Take by mouth.     valACYclovir (VALTREX) 1000 MG tablet Take 1,000 mg by mouth 2 (two) times daily as needed (cold sores).     Wheat Dextrin (BENEFIBER) POWD Take 1 packet by mouth daily as needed (constipation).     nystatin (MYCOSTATIN/NYSTOP) powder Apply 1 application topically 3 (three) times daily. Apply to affected area for up to 7 days (Patient not taking: Reported on 11/30/2021)  15 g 5   No current facility-administered medications for this visit.     ALLERGIES: Celebrex [celecoxib]  Family History  Problem Relation Age of Onset   Breast cancer Maternal Aunt        Age unknown    Social History   Socioeconomic History   Marital status: Single    Spouse name: Not on file   Number of children: Not on file   Years of education: Not on file   Highest education level: Not on file  Occupational History   Not on file  Tobacco Use   Smoking status: Never   Smokeless tobacco: Never  Vaping Use   Vaping Use: Never used  Substance and Sexual Activity   Alcohol use: No    Alcohol/week: 0.0 standard drinks of alcohol   Drug use: No   Sexual activity: Not Currently    Comment: 1st intercourse 51 yo-More than 5 partners  Other Topics Concern   Not on file  Social History Narrative   Not on file   Social Determinants of Health   Financial Resource Strain: Not on file  Food Insecurity: Not on file  Transportation Needs: Not on file  Physical Activity: Not on file  Stress: Not on file  Social Connections: Not on file  Intimate Partner Violence: Not on file    Review of Systems  All other systems reviewed and are negative.   PHYSICAL EXAMINATION:    BP 132/84 (BP Location: Right Arm, Patient Position: Sitting, Cuff Size: Large)   Pulse 70   Ht '5\' 6"'$  (1.676 m)   Wt (!) 319 lb (144.7 kg)   LMP 04/20/2012   SpO2 97%   BMI 51.49 kg/m     General appearance: alert, cooperative and appears stated age  Pelvic: External genitalia:  no lesions              Urethra:  normal appearing urethra with no masses, tenderness or lesions              Bartholins and Skenes: normal                 Vagina: normal appearing vagina with normal color and discharge, no lesions              Cervix: no lesions                Chaperone was present for exam:  yes.  ASSESSMENT  Vulvitis improved.  No lesions noted today.  PLAN  No biopsy is needed.  Lotrisone  bid x 2 weeks  as needed for a vulvitis flare and then twice weekly at hs if needed for maintenance.  Fu prn.    An After Visit Summary was printed and given to the patient.  13 min  total time was spent for this patient encounter, including preparation, face-to-face counseling with the patient, coordination of care, and documentation of the encounter.

## 2021-12-27 ENCOUNTER — Ambulatory Visit: Payer: BC Managed Care – PPO | Admitting: Obstetrics and Gynecology

## 2021-12-27 ENCOUNTER — Encounter: Payer: Self-pay | Admitting: Obstetrics and Gynecology

## 2021-12-27 VITALS — BP 132/84 | HR 70 | Ht 66.0 in | Wt 319.0 lb

## 2021-12-27 DIAGNOSIS — N763 Subacute and chronic vulvitis: Secondary | ICD-10-CM

## 2021-12-30 ENCOUNTER — Ambulatory Visit: Payer: BC Managed Care – PPO

## 2022-01-14 ENCOUNTER — Other Ambulatory Visit: Payer: Self-pay | Admitting: Family Medicine

## 2022-01-14 ENCOUNTER — Ambulatory Visit
Admission: RE | Admit: 2022-01-14 | Discharge: 2022-01-14 | Disposition: A | Discharge status: Home / Self Care | Payer: BC Managed Care – PPO | Source: Ambulatory Visit | Attending: Family Medicine | Admitting: Family Medicine

## 2022-01-14 DIAGNOSIS — J189 Pneumonia, unspecified organism: Secondary | ICD-10-CM | POA: Diagnosis not present

## 2022-01-14 DIAGNOSIS — R051 Acute cough: Secondary | ICD-10-CM | POA: Diagnosis not present

## 2022-01-14 DIAGNOSIS — I10 Essential (primary) hypertension: Secondary | ICD-10-CM | POA: Diagnosis not present

## 2022-01-14 DIAGNOSIS — Z03818 Encounter for observation for suspected exposure to other biological agents ruled out: Secondary | ICD-10-CM | POA: Diagnosis not present

## 2022-01-26 DIAGNOSIS — M329 Systemic lupus erythematosus, unspecified: Secondary | ICD-10-CM | POA: Diagnosis not present

## 2022-01-26 DIAGNOSIS — M1990 Unspecified osteoarthritis, unspecified site: Secondary | ICD-10-CM | POA: Diagnosis not present

## 2022-01-26 DIAGNOSIS — Z79899 Other long term (current) drug therapy: Secondary | ICD-10-CM | POA: Diagnosis not present

## 2022-01-26 DIAGNOSIS — K5190 Ulcerative colitis, unspecified, without complications: Secondary | ICD-10-CM | POA: Diagnosis not present

## 2022-01-26 DIAGNOSIS — J189 Pneumonia, unspecified organism: Secondary | ICD-10-CM | POA: Diagnosis not present

## 2022-01-26 DIAGNOSIS — I10 Essential (primary) hypertension: Secondary | ICD-10-CM | POA: Diagnosis not present

## 2022-01-30 DIAGNOSIS — Z79899 Other long term (current) drug therapy: Secondary | ICD-10-CM | POA: Diagnosis not present

## 2022-01-30 DIAGNOSIS — H43393 Other vitreous opacities, bilateral: Secondary | ICD-10-CM | POA: Diagnosis not present

## 2022-01-30 DIAGNOSIS — H524 Presbyopia: Secondary | ICD-10-CM | POA: Diagnosis not present

## 2022-02-22 ENCOUNTER — Ambulatory Visit
Admission: RE | Admit: 2022-02-22 | Discharge: 2022-02-22 | Disposition: A | Discharge status: Home / Self Care | Payer: BC Managed Care – PPO | Source: Ambulatory Visit | Attending: Family Medicine | Admitting: Family Medicine

## 2022-02-22 DIAGNOSIS — Z1231 Encounter for screening mammogram for malignant neoplasm of breast: Secondary | ICD-10-CM | POA: Diagnosis not present

## 2022-03-21 DIAGNOSIS — R635 Abnormal weight gain: Secondary | ICD-10-CM | POA: Diagnosis not present

## 2022-03-21 DIAGNOSIS — Z1331 Encounter for screening for depression: Secondary | ICD-10-CM | POA: Diagnosis not present

## 2022-03-21 DIAGNOSIS — I10 Essential (primary) hypertension: Secondary | ICD-10-CM | POA: Diagnosis not present

## 2022-03-21 DIAGNOSIS — E559 Vitamin D deficiency, unspecified: Secondary | ICD-10-CM | POA: Diagnosis not present

## 2022-03-21 DIAGNOSIS — Z131 Encounter for screening for diabetes mellitus: Secondary | ICD-10-CM | POA: Diagnosis not present

## 2022-03-21 DIAGNOSIS — G4733 Obstructive sleep apnea (adult) (pediatric): Secondary | ICD-10-CM | POA: Diagnosis not present

## 2022-04-04 DIAGNOSIS — I10 Essential (primary) hypertension: Secondary | ICD-10-CM | POA: Diagnosis not present

## 2022-04-04 DIAGNOSIS — G4733 Obstructive sleep apnea (adult) (pediatric): Secondary | ICD-10-CM | POA: Diagnosis not present

## 2022-04-04 DIAGNOSIS — E559 Vitamin D deficiency, unspecified: Secondary | ICD-10-CM | POA: Diagnosis not present

## 2022-04-12 DIAGNOSIS — G4733 Obstructive sleep apnea (adult) (pediatric): Secondary | ICD-10-CM | POA: Diagnosis not present

## 2022-04-18 DIAGNOSIS — I10 Essential (primary) hypertension: Secondary | ICD-10-CM | POA: Diagnosis not present

## 2022-04-18 DIAGNOSIS — G4733 Obstructive sleep apnea (adult) (pediatric): Secondary | ICD-10-CM | POA: Diagnosis not present

## 2022-04-18 DIAGNOSIS — E559 Vitamin D deficiency, unspecified: Secondary | ICD-10-CM | POA: Diagnosis not present

## 2022-04-29 DIAGNOSIS — Z131 Encounter for screening for diabetes mellitus: Secondary | ICD-10-CM | POA: Diagnosis not present

## 2022-04-29 DIAGNOSIS — Z79899 Other long term (current) drug therapy: Secondary | ICD-10-CM | POA: Diagnosis not present

## 2022-04-29 DIAGNOSIS — Z0000 Encounter for general adult medical examination without abnormal findings: Secondary | ICD-10-CM | POA: Diagnosis not present

## 2022-04-29 DIAGNOSIS — I10 Essential (primary) hypertension: Secondary | ICD-10-CM | POA: Diagnosis not present

## 2022-05-18 DIAGNOSIS — G4733 Obstructive sleep apnea (adult) (pediatric): Secondary | ICD-10-CM | POA: Diagnosis not present

## 2022-05-18 DIAGNOSIS — E559 Vitamin D deficiency, unspecified: Secondary | ICD-10-CM | POA: Diagnosis not present

## 2022-05-18 DIAGNOSIS — I10 Essential (primary) hypertension: Secondary | ICD-10-CM | POA: Diagnosis not present

## 2022-06-02 DIAGNOSIS — I10 Essential (primary) hypertension: Secondary | ICD-10-CM | POA: Diagnosis not present

## 2022-06-02 DIAGNOSIS — G4733 Obstructive sleep apnea (adult) (pediatric): Secondary | ICD-10-CM | POA: Diagnosis not present

## 2022-07-27 DIAGNOSIS — G4733 Obstructive sleep apnea (adult) (pediatric): Secondary | ICD-10-CM | POA: Diagnosis not present

## 2022-08-03 DIAGNOSIS — G4733 Obstructive sleep apnea (adult) (pediatric): Secondary | ICD-10-CM | POA: Diagnosis not present

## 2022-08-10 DIAGNOSIS — I10 Essential (primary) hypertension: Secondary | ICD-10-CM | POA: Diagnosis not present

## 2022-08-10 DIAGNOSIS — G4733 Obstructive sleep apnea (adult) (pediatric): Secondary | ICD-10-CM | POA: Diagnosis not present

## 2022-08-10 DIAGNOSIS — E559 Vitamin D deficiency, unspecified: Secondary | ICD-10-CM | POA: Diagnosis not present

## 2022-08-12 DIAGNOSIS — I10 Essential (primary) hypertension: Secondary | ICD-10-CM | POA: Diagnosis not present

## 2022-08-12 DIAGNOSIS — M25562 Pain in left knee: Secondary | ICD-10-CM | POA: Diagnosis not present

## 2022-08-15 ENCOUNTER — Ambulatory Visit
Admission: RE | Admit: 2022-08-15 | Discharge: 2022-08-15 | Disposition: A | Discharge status: Home / Self Care | Payer: BC Managed Care – PPO | Source: Ambulatory Visit | Attending: Family Medicine | Admitting: Family Medicine

## 2022-08-15 ENCOUNTER — Other Ambulatory Visit: Payer: Self-pay | Admitting: Family Medicine

## 2022-08-15 DIAGNOSIS — M25562 Pain in left knee: Secondary | ICD-10-CM

## 2022-08-15 DIAGNOSIS — M25462 Effusion, left knee: Secondary | ICD-10-CM | POA: Diagnosis not present

## 2022-08-17 DIAGNOSIS — K5190 Ulcerative colitis, unspecified, without complications: Secondary | ICD-10-CM | POA: Diagnosis not present

## 2022-08-17 DIAGNOSIS — M329 Systemic lupus erythematosus, unspecified: Secondary | ICD-10-CM | POA: Diagnosis not present

## 2022-08-17 DIAGNOSIS — M1990 Unspecified osteoarthritis, unspecified site: Secondary | ICD-10-CM | POA: Diagnosis not present

## 2022-08-17 DIAGNOSIS — Z79899 Other long term (current) drug therapy: Secondary | ICD-10-CM | POA: Diagnosis not present

## 2022-08-23 DIAGNOSIS — H43813 Vitreous degeneration, bilateral: Secondary | ICD-10-CM | POA: Diagnosis not present

## 2022-08-23 DIAGNOSIS — H43393 Other vitreous opacities, bilateral: Secondary | ICD-10-CM | POA: Diagnosis not present

## 2022-08-23 DIAGNOSIS — Z79899 Other long term (current) drug therapy: Secondary | ICD-10-CM | POA: Diagnosis not present

## 2022-09-03 DIAGNOSIS — G4733 Obstructive sleep apnea (adult) (pediatric): Secondary | ICD-10-CM | POA: Diagnosis not present

## 2022-09-14 DIAGNOSIS — M25561 Pain in right knee: Secondary | ICD-10-CM | POA: Diagnosis not present

## 2022-09-14 DIAGNOSIS — M1712 Unilateral primary osteoarthritis, left knee: Secondary | ICD-10-CM | POA: Diagnosis not present

## 2022-09-26 DIAGNOSIS — G4733 Obstructive sleep apnea (adult) (pediatric): Secondary | ICD-10-CM | POA: Diagnosis not present

## 2022-10-04 DIAGNOSIS — G4733 Obstructive sleep apnea (adult) (pediatric): Secondary | ICD-10-CM | POA: Diagnosis not present

## 2022-10-04 DIAGNOSIS — I10 Essential (primary) hypertension: Secondary | ICD-10-CM | POA: Diagnosis not present

## 2022-10-04 DIAGNOSIS — E559 Vitamin D deficiency, unspecified: Secondary | ICD-10-CM | POA: Diagnosis not present

## 2022-10-14 DIAGNOSIS — G4733 Obstructive sleep apnea (adult) (pediatric): Secondary | ICD-10-CM | POA: Diagnosis not present

## 2022-11-03 DIAGNOSIS — G4733 Obstructive sleep apnea (adult) (pediatric): Secondary | ICD-10-CM | POA: Diagnosis not present

## 2022-11-16 DIAGNOSIS — E559 Vitamin D deficiency, unspecified: Secondary | ICD-10-CM | POA: Diagnosis not present

## 2022-11-16 DIAGNOSIS — I10 Essential (primary) hypertension: Secondary | ICD-10-CM | POA: Diagnosis not present

## 2022-11-16 DIAGNOSIS — G4733 Obstructive sleep apnea (adult) (pediatric): Secondary | ICD-10-CM | POA: Diagnosis not present

## 2023-01-30 DIAGNOSIS — G4733 Obstructive sleep apnea (adult) (pediatric): Secondary | ICD-10-CM | POA: Diagnosis not present

## 2023-01-30 DIAGNOSIS — E559 Vitamin D deficiency, unspecified: Secondary | ICD-10-CM | POA: Diagnosis not present

## 2023-01-30 DIAGNOSIS — I10 Essential (primary) hypertension: Secondary | ICD-10-CM | POA: Diagnosis not present

## 2023-02-01 DIAGNOSIS — Z79899 Other long term (current) drug therapy: Secondary | ICD-10-CM | POA: Diagnosis not present

## 2023-02-01 DIAGNOSIS — H43393 Other vitreous opacities, bilateral: Secondary | ICD-10-CM | POA: Diagnosis not present

## 2023-02-09 ENCOUNTER — Other Ambulatory Visit: Payer: Self-pay | Admitting: Family Medicine

## 2023-02-09 DIAGNOSIS — K5190 Ulcerative colitis, unspecified, without complications: Secondary | ICD-10-CM | POA: Diagnosis not present

## 2023-02-09 DIAGNOSIS — Z79899 Other long term (current) drug therapy: Secondary | ICD-10-CM | POA: Diagnosis not present

## 2023-02-09 DIAGNOSIS — Z1231 Encounter for screening mammogram for malignant neoplasm of breast: Secondary | ICD-10-CM

## 2023-02-09 DIAGNOSIS — M329 Systemic lupus erythematosus, unspecified: Secondary | ICD-10-CM | POA: Diagnosis not present

## 2023-02-09 DIAGNOSIS — M1990 Unspecified osteoarthritis, unspecified site: Secondary | ICD-10-CM | POA: Diagnosis not present

## 2023-02-27 ENCOUNTER — Ambulatory Visit: Payer: BC Managed Care – PPO

## 2023-03-08 ENCOUNTER — Ambulatory Visit: Payer: BC Managed Care – PPO

## 2023-03-16 ENCOUNTER — Ambulatory Visit
Admission: RE | Admit: 2023-03-16 | Discharge: 2023-03-16 | Disposition: A | Discharge status: Home / Self Care | Payer: BC Managed Care – PPO | Source: Ambulatory Visit | Attending: Family Medicine | Admitting: Family Medicine

## 2023-03-16 DIAGNOSIS — Z1231 Encounter for screening mammogram for malignant neoplasm of breast: Secondary | ICD-10-CM | POA: Diagnosis not present

## 2023-03-21 ENCOUNTER — Other Ambulatory Visit: Payer: Self-pay | Admitting: Gastroenterology

## 2023-03-21 DIAGNOSIS — Z8719 Personal history of other diseases of the digestive system: Secondary | ICD-10-CM | POA: Diagnosis not present

## 2023-03-22 ENCOUNTER — Other Ambulatory Visit: Payer: Self-pay | Admitting: Family Medicine

## 2023-03-22 DIAGNOSIS — R928 Other abnormal and inconclusive findings on diagnostic imaging of breast: Secondary | ICD-10-CM

## 2023-03-29 DIAGNOSIS — G4733 Obstructive sleep apnea (adult) (pediatric): Secondary | ICD-10-CM | POA: Diagnosis not present

## 2023-04-04 NOTE — Progress Notes (Signed)
 53 y.o. G35P0020 Single African American female here for annual exam.    Using honeypot pads for her incontinence and her vulvar skin is less irritated. Not using Lotrisone cream often but would like a refill.  Incontinence occurs every now and then if she drinks a lot of water or waits to go the bathroom.  Random leakage with a sneeze.  Has not done pelvic floor therapy.   No constipation or accidental leak of stool.  Benefiber helps constipation.   2 - 3 sodas or teas total per day.   Occasional hot spell.   Has arthritis in her knees.  Does swimming for exercise.   Has lupus.   Sees physician at [facility] for weight loss.  Not taking weight loss medication.   PCP: Mila Palmer, MD   Patient's last menstrual period was 04/20/2012.           Sexually active: No.  The current method of family planning is hyst.    Menopausal hormone therapy:  n/a Exercising: No.   Smoker:  no  OB History  Gravida Para Term Preterm AB Living  2    2 0  SAB IAB Ectopic Multiple Live Births  2        # Outcome Date GA Lbr Len/2nd Weight Sex Type Anes PTL Lv  2 SAB           1 SAB              HEALTH MAINTENANCE: Last 2 paps:  11-26-19 Neg, 11-14-16 Neg,  History of abnormal Pap or positive HPV:  no Mammogram:   03/16/23 Breast density cat B, BI-RADS CAT 0 incomplete Had left axillary biopsy showing reactive lymphoid hyperplasia.  No lymphoproliferative disorder.  Screening mammogram due in Feb. 2026.  Colonoscopy:  12/29/20; due in 6/25  Bone Density:  n/a  Result  n/a   Immunization History  Administered Date(s) Administered   Influenza Split 11/07/2007, 10/12/2016, 09/25/2017, 09/18/2018   Influenza,inj,Quad PF,6+ Mos 10/27/2015, 10/06/2017   Influenza,inj,quad, With Preservative 10/03/2014   PFIZER(Purple Top)SARS-COV-2 Vaccination 04/03/2019, 04/24/2019, 09/17/2019   Pneumococcal Polysaccharide-23 11/11/2016   Tdap 05/05/2006, 11/11/2016   Zoster, Live  02/16/2018, 07/09/2018      reports that she has never smoked. She has never used smokeless tobacco. She reports that she does not drink alcohol and does not use drugs.  Past Medical History:  Diagnosis Date   Hypertension    BORDERLINE   Lupus    DX-SKIN BIOPSY-10   Shortness of breath    on exertion due to wt gain per pt   Sleep apnea    mild-no CPAP    Past Surgical History:  Procedure Laterality Date   ABDOMINAL HYSTERECTOMY N/A 07/09/2012   Procedure: HYSTERECTOMY ABDOMINAL;  Surgeon: Dara Lords, MD;  Location: [facility];  Service: Gynecology;  Laterality: N/A;   ABDOMINAL SURGERY  07/2002   ABDOMINAL MYOMECTOMY   BIOPSY  12/29/2020   Procedure: BIOPSY;  Surgeon: Kerin Salen, MD;  Location: [facility];  Service: Gastroenterology;;   COLONOSCOPY WITH PROPOFOL N/A 12/29/2020   Procedure: COLONOSCOPY WITH PROPOFOL;  Surgeon: Kerin Salen, MD;  Location: [facility];  Service: Gastroenterology;  Laterality: N/A;   ESOPHAGOGASTRODUODENOSCOPY (EGD) WITH PROPOFOL N/A 12/28/2015   Procedure: ESOPHAGOGASTRODUODENOSCOPY (EGD) WITH PROPOFOL;  Surgeon: Charolett Bumpers, MD;  Location: [facility];  Service: Endoscopy;  Laterality: N/A;   FLEXIBLE SIGMOIDOSCOPY N/A 12/28/2015   Procedure: FLEXIBLE SIGMOIDOSCOPY;  Surgeon: Charolett Bumpers, MD;  Location:  [facility];  Service: Endoscopy;  Laterality: N/A;   POLYPECTOMY  12/29/2020   Procedure: POLYPECTOMY;  Surgeon: Kerin Salen, MD;  Location: [facility];  Service: Gastroenterology;;    Current Outpatient Medications  Medication Sig Dispense Refill   Ascorbic Acid (VITAMIN C) 500 MG CHEW Chew 500 mg by mouth daily.     calcium carbonate (OS-CAL - DOSED IN MG OF ELEMENTAL CALCIUM) 1250 (500 Ca) MG tablet Take 1 tablet by mouth daily with breakfast.     Cholecalciferol (VITAMIN D3) 5000 units CAPS Take 5,000 Units by mouth daily.     clotrimazole-betamethasone (LOTRISONE) cream Apply 1 Application topically 2 (two) times daily.  Use for 2 weeks at a time. 30 g 0   Cyanocobalamin (VITAMIN B-12) 2500 MCG SUBL Place 2,500 mcg under the tongue daily.     Garlic 1000 MG CAPS Take 1,000 mg by mouth daily.     hydroxychloroquine (PLAQUENIL) 200 MG tablet Take 200 mg by mouth daily.      losartan-hydrochlorothiazide (HYZAAR) 100-12.5 MG tablet Take 1 tablet by mouth daily.     mesalamine (APRISO) 0.375 g 24 hr capsule 1.5 g daily.     Multiple Vitamin (MULTIVITAMIN) tablet Take 1 tablet by mouth daily.     nystatin (MYCOSTATIN/NYSTOP) powder Apply 1 application topically 3 (three) times daily. Apply to affected area for up to 7 days 15 g 5   OMEGA-3 FATTY ACIDS PO Take 1 capsule by mouth daily.     polyethylene glycol-electrolytes (NULYTELY) 420 g solution Take by mouth.     valACYclovir (VALTREX) 1000 MG tablet Take 1,000 mg by mouth 2 (two) times daily as needed (cold sores).     Wheat Dextrin (BENEFIBER) POWD Take 1 packet by mouth daily as needed (constipation).     No current facility-administered medications for this visit.    ALLERGIES: Celebrex [celecoxib]  Family History  Problem Relation Age of Onset   Breast cancer Maternal Aunt        Age unknown    Review of Systems  All other systems reviewed and are negative.   PHYSICAL EXAM:  BP 130/84 (BP Location: Left Arm, Patient Position: Sitting, Cuff Size: Large)   Pulse 72   Ht 5' 4.75" (1.645 m)   Wt (!) 315 lb (142.9 kg)   LMP 04/20/2012   SpO2 97%   BMI 52.82 kg/m     General appearance: alert, cooperative and appears stated age Head: normocephalic, without obvious abnormality, atraumatic Neck: no adenopathy, supple, symmetrical, trachea midline and thyroid normal to inspection and palpation Lungs: clear to auscultation bilaterally Breasts: normal appearance, no masses or tenderness, No nipple retraction or dimpling, No nipple discharge or bleeding, No axillary adenopathy Heart: regular rate and rhythm Abdomen: soft, non-tender; no masses, no  organomegaly Extremities: extremities normal, atraumatic, no cyanosis or edema Skin: skin color, texture, turgor normal. No rashes or lesions Lymph nodes: cervical, supraclavicular, and axillary nodes normal. Neurologic: grossly normal  Pelvic: External genitalia:  no lesions              No abnormal inguinal nodes palpated.              Urethra:  normal appearing urethra with no masses, tenderness or lesions              Bartholins and Skenes: normal                 Vagina: normal appearing vagina with normal color and discharge, no lesions  Cervix: absent              Pap taken: No. Bimanual Exam:  Uterus:  absent              Adnexa: no mass, fullness, tenderness              Rectal exam: yes.  Confirms.              Anus:  normal sphincter tone, no lesions  Chaperone was present for exam:  Warren Lacy, CMA  ASSESSMENT: Well woman visit with gynecologic exam. Status post TAH.  Hx HSV I.  Using Valtrex prn.  Mild stress incontinence.  Hx lupus.  PHQ-9: 0  PLAN: Mammogram screening discussed. Self breast awareness reviewed. Pap and HRV collected:  no Guidelines for Calcium, Vitamin D, regular exercise program including cardiovascular and weight bearing exercise. Medication refills:  Lotrisone.  Kegels.  Video information provided.  Follow up:  yearly and prn.

## 2023-04-05 ENCOUNTER — Ambulatory Visit
Admission: RE | Admit: 2023-04-05 | Discharge: 2023-04-05 | Disposition: A | Discharge status: Home / Self Care | Source: Ambulatory Visit | Attending: Family Medicine | Admitting: Family Medicine

## 2023-04-05 ENCOUNTER — Other Ambulatory Visit

## 2023-04-05 ENCOUNTER — Other Ambulatory Visit: Payer: Self-pay | Admitting: Family Medicine

## 2023-04-05 DIAGNOSIS — R928 Other abnormal and inconclusive findings on diagnostic imaging of breast: Secondary | ICD-10-CM

## 2023-04-05 DIAGNOSIS — R590 Localized enlarged lymph nodes: Secondary | ICD-10-CM | POA: Diagnosis not present

## 2023-04-05 DIAGNOSIS — R599 Enlarged lymph nodes, unspecified: Secondary | ICD-10-CM

## 2023-04-11 ENCOUNTER — Other Ambulatory Visit (HOSPITAL_COMMUNITY)
Admission: RE | Admit: 2023-04-11 | Discharge: 2023-04-11 | Disposition: A | Discharge status: Home / Self Care | Source: Ambulatory Visit | Attending: Family Medicine | Admitting: Family Medicine

## 2023-04-11 ENCOUNTER — Ambulatory Visit
Admission: RE | Admit: 2023-04-11 | Discharge: 2023-04-11 | Disposition: A | Discharge status: Home / Self Care | Source: Ambulatory Visit | Attending: Family Medicine | Admitting: Family Medicine

## 2023-04-11 DIAGNOSIS — R599 Enlarged lymph nodes, unspecified: Secondary | ICD-10-CM

## 2023-04-11 DIAGNOSIS — R590 Localized enlarged lymph nodes: Secondary | ICD-10-CM | POA: Diagnosis not present

## 2023-04-11 DIAGNOSIS — R928 Other abnormal and inconclusive findings on diagnostic imaging of breast: Secondary | ICD-10-CM

## 2023-04-12 DIAGNOSIS — G4733 Obstructive sleep apnea (adult) (pediatric): Secondary | ICD-10-CM | POA: Diagnosis not present

## 2023-04-12 DIAGNOSIS — I10 Essential (primary) hypertension: Secondary | ICD-10-CM | POA: Diagnosis not present

## 2023-04-12 DIAGNOSIS — E559 Vitamin D deficiency, unspecified: Secondary | ICD-10-CM | POA: Diagnosis not present

## 2023-04-12 LAB — SURGICAL PATHOLOGY

## 2023-04-13 ENCOUNTER — Other Ambulatory Visit

## 2023-04-18 ENCOUNTER — Encounter: Payer: Self-pay | Admitting: Obstetrics and Gynecology

## 2023-04-18 ENCOUNTER — Ambulatory Visit (INDEPENDENT_AMBULATORY_CARE_PROVIDER_SITE_OTHER): Payer: BC Managed Care – PPO | Admitting: Obstetrics and Gynecology

## 2023-04-18 VITALS — BP 130/84 | HR 72 | Ht 64.75 in | Wt 315.0 lb

## 2023-04-18 DIAGNOSIS — Z1331 Encounter for screening for depression: Secondary | ICD-10-CM

## 2023-04-18 DIAGNOSIS — Z01419 Encounter for gynecological examination (general) (routine) without abnormal findings: Secondary | ICD-10-CM

## 2023-04-18 MED ORDER — CLOTRIMAZOLE-BETAMETHASONE 1-0.05 % EX CREA: 1.0000 | TOPICAL_CREAM | Freq: Two times a day (BID) | CUTANEOUS | 0 refills | Status: AC

## 2023-04-18 NOTE — Patient Instructions (Addendum)
 Kegel Exercises There are many reasons your provider may suggest Kegel exercises. This video will teach you how to do Kegel exercises. To view the content, go to this web address: [email or web address]  This video will expire on: 12/28/2024. If you need access to this video following this date, please reach out to the healthcare provider who assigned it to you. This information is not intended to replace advice given to you by your health care provider. Make sure you discuss any questions you have with your health care provider. Elsevier Patient Education  2024 [employer] Inc.  EXERCISE AND DIET:  We recommended that you start or continue a regular exercise program for good health. Regular exercise means any activity that makes your heart beat faster and makes you sweat.  We recommend exercising at least 30 minutes per day at least 3 days a week, preferably 4 or 5.  We also recommend a diet low in fat and sugar.  Inactivity, poor dietary choices and obesity can cause diabetes, heart attack, stroke, and kidney damage, among others.    ALCOHOL AND SMOKING:  Women should limit their alcohol intake to no more than 7 drinks/beers/glasses of wine (combined, not each!) per week. Moderation of alcohol intake to this level decreases your risk of breast cancer and liver damage. And of course, no recreational drugs are part of a healthy lifestyle.  And absolutely no smoking or even second hand smoke. Most people know smoking can cause heart and lung diseases, but did you know it also contributes to weakening of your bones? Aging of your skin?  Yellowing of your teeth and nails?  CALCIUM AND VITAMIN D:  Adequate intake of calcium and Vitamin D are recommended.  The recommendations for exact amounts of these supplements seem to change often, but generally speaking 600 mg of calcium (either carbonate or citrate) and 800 units of Vitamin D per day seems prudent. Certain women may benefit from higher intake  of Vitamin D.  If you are among these women, your doctor will have told you during your visit.    PAP SMEARS:  Pap smears, to check for cervical cancer or precancers,  have traditionally been done yearly, although recent scientific advances have shown that most women can have pap smears less often.  However, every woman still should have a physical exam from her gynecologist every year. It will include a breast check, inspection of the vulva and vagina to check for abnormal growths or skin changes, a visual exam of the cervix, and then an exam to evaluate the size and shape of the uterus and ovaries.  And after 55 years of age, a rectal exam is indicated to check for rectal cancers. We will also provide age appropriate advice regarding health maintenance, like when you should have certain vaccines, screening for sexually transmitted diseases, bone density testing, colonoscopy, mammograms, etc.   MAMMOGRAMS:  All women over 61 years old should have a yearly mammogram. Many facilities now offer a "3D" mammogram, which may cost around $50 extra out of pocket. If possible,  we recommend you accept the option to have the 3D mammogram performed.  It both reduces the number of women who will be called back for extra views which then turn out to be normal, and it is better than the routine mammogram at detecting truly abnormal areas.    COLONOSCOPY:  Colonoscopy to screen for colon cancer is recommended for all women at age 57.  We know, you hate the idea of the  prep.  We agree, BUT, having colon cancer and not knowing it is worse!!  Colon cancer so often starts as a polyp that can be seen and removed at colonscopy, which can quite literally save your life!  And if your first colonoscopy is normal and you have no family history of colon cancer, most women don't have to have it again for 10 years.  Once every ten years, you can do something that may end up saving your life, right?  We will be happy to help you get it  scheduled when you are ready.  Be sure to check your insurance coverage so you understand how much it will cost.  It may be covered as a preventative service at no cost, but you should check your particular policy.

## 2023-04-19 DIAGNOSIS — M1711 Unilateral primary osteoarthritis, right knee: Secondary | ICD-10-CM | POA: Diagnosis not present

## 2023-04-19 DIAGNOSIS — M25562 Pain in left knee: Secondary | ICD-10-CM | POA: Diagnosis not present

## 2023-05-01 DIAGNOSIS — Z79899 Other long term (current) drug therapy: Secondary | ICD-10-CM | POA: Diagnosis not present

## 2023-05-01 DIAGNOSIS — Z0000 Encounter for general adult medical examination without abnormal findings: Secondary | ICD-10-CM | POA: Diagnosis not present

## 2023-05-01 DIAGNOSIS — Z131 Encounter for screening for diabetes mellitus: Secondary | ICD-10-CM | POA: Diagnosis not present

## 2023-05-01 DIAGNOSIS — I10 Essential (primary) hypertension: Secondary | ICD-10-CM | POA: Diagnosis not present

## 2023-05-01 DIAGNOSIS — E559 Vitamin D deficiency, unspecified: Secondary | ICD-10-CM | POA: Diagnosis not present

## 2023-05-02 DIAGNOSIS — R7309 Other abnormal glucose: Secondary | ICD-10-CM

## 2023-05-02 HISTORY — DX: Other abnormal glucose: R73.09

## 2023-05-02 LAB — LAB REPORT - SCANNED
A1c: 5.8
TSH: 0.51 (ref 0.41–5.90)

## 2023-05-08 ENCOUNTER — Encounter: Payer: Self-pay | Admitting: Obstetrics and Gynecology

## 2023-06-28 DIAGNOSIS — Z79899 Other long term (current) drug therapy: Secondary | ICD-10-CM | POA: Diagnosis not present

## 2023-06-28 DIAGNOSIS — H43813 Vitreous degeneration, bilateral: Secondary | ICD-10-CM | POA: Diagnosis not present

## 2023-06-28 DIAGNOSIS — H43393 Other vitreous opacities, bilateral: Secondary | ICD-10-CM | POA: Diagnosis not present

## 2023-07-25 DIAGNOSIS — G4733 Obstructive sleep apnea (adult) (pediatric): Secondary | ICD-10-CM | POA: Diagnosis not present

## 2023-07-25 DIAGNOSIS — E559 Vitamin D deficiency, unspecified: Secondary | ICD-10-CM | POA: Diagnosis not present

## 2023-07-25 DIAGNOSIS — I10 Essential (primary) hypertension: Secondary | ICD-10-CM | POA: Diagnosis not present

## 2023-08-04 DIAGNOSIS — R609 Edema, unspecified: Secondary | ICD-10-CM | POA: Diagnosis not present

## 2023-08-14 DIAGNOSIS — M329 Systemic lupus erythematosus, unspecified: Secondary | ICD-10-CM | POA: Diagnosis not present

## 2023-08-14 DIAGNOSIS — K5190 Ulcerative colitis, unspecified, without complications: Secondary | ICD-10-CM | POA: Diagnosis not present

## 2023-08-14 DIAGNOSIS — M1990 Unspecified osteoarthritis, unspecified site: Secondary | ICD-10-CM | POA: Diagnosis not present

## 2023-08-14 DIAGNOSIS — Z79899 Other long term (current) drug therapy: Secondary | ICD-10-CM | POA: Diagnosis not present

## 2023-08-29 ENCOUNTER — Encounter (HOSPITAL_COMMUNITY): Payer: Self-pay

## 2023-08-29 ENCOUNTER — Ambulatory Visit (HOSPITAL_COMMUNITY): Admit: 2023-08-29 | Admitting: Gastroenterology

## 2023-08-29 SURGERY — COLONOSCOPY
Anesthesia: Monitor Anesthesia Care

## 2023-08-31 DIAGNOSIS — R202 Paresthesia of skin: Secondary | ICD-10-CM | POA: Diagnosis not present

## 2023-08-31 DIAGNOSIS — R200 Anesthesia of skin: Secondary | ICD-10-CM | POA: Diagnosis not present

## 2023-09-21 ENCOUNTER — Other Ambulatory Visit: Payer: Self-pay | Admitting: Gastroenterology

## 2023-10-11 ENCOUNTER — Other Ambulatory Visit: Payer: Self-pay | Admitting: Orthopedic Surgery

## 2023-10-11 DIAGNOSIS — G5603 Carpal tunnel syndrome, bilateral upper limbs: Secondary | ICD-10-CM | POA: Diagnosis not present

## 2023-10-11 DIAGNOSIS — G5613 Other lesions of median nerve, bilateral upper limbs: Secondary | ICD-10-CM | POA: Diagnosis not present

## 2023-10-26 ENCOUNTER — Encounter (HOSPITAL_BASED_OUTPATIENT_CLINIC_OR_DEPARTMENT_OTHER): Payer: Self-pay | Admitting: Orthopedic Surgery

## 2023-11-01 ENCOUNTER — Encounter (HOSPITAL_BASED_OUTPATIENT_CLINIC_OR_DEPARTMENT_OTHER)
Admission: RE | Admit: 2023-11-01 | Discharge: 2023-11-01 | Disposition: A | Discharge status: Home / Self Care | Source: Ambulatory Visit | Attending: Orthopedic Surgery | Admitting: Orthopedic Surgery

## 2023-11-01 DIAGNOSIS — G4730 Sleep apnea, unspecified: Secondary | ICD-10-CM | POA: Diagnosis not present

## 2023-11-01 DIAGNOSIS — Z01818 Encounter for other preprocedural examination: Secondary | ICD-10-CM | POA: Insufficient documentation

## 2023-11-01 DIAGNOSIS — I10 Essential (primary) hypertension: Secondary | ICD-10-CM | POA: Diagnosis not present

## 2023-11-01 DIAGNOSIS — G5602 Carpal tunnel syndrome, left upper limb: Secondary | ICD-10-CM | POA: Diagnosis not present

## 2023-11-01 LAB — BASIC METABOLIC PANEL WITH GFR
Anion gap: 9 (ref 5–15)
BUN: 12 mg/dL (ref 6–20)
CO2: 24 mmol/L (ref 22–32)
Calcium: 9.1 mg/dL (ref 8.9–10.3)
Chloride: 104 mmol/L (ref 98–111)
Creatinine, Ser: 0.97 mg/dL (ref 0.44–1.00)
GFR, Estimated: >60 mL/min (ref >60)
Glucose, Bld: 89 mg/dL (ref 70–99)
Potassium: 4.2 mmol/L (ref 3.5–5.1)
Sodium: 137 mmol/L (ref 135–145)

## 2023-11-01 NOTE — Progress Notes (Signed)
 BMI 51.9, Dr. Lucious aware, will proceed with surgery as scheduled.

## 2023-11-01 NOTE — Progress Notes (Signed)

## 2023-11-03 ENCOUNTER — Ambulatory Visit (HOSPITAL_BASED_OUTPATIENT_CLINIC_OR_DEPARTMENT_OTHER): Admitting: Anesthesiology

## 2023-11-03 ENCOUNTER — Encounter (HOSPITAL_BASED_OUTPATIENT_CLINIC_OR_DEPARTMENT_OTHER): Payer: Self-pay | Admitting: Orthopedic Surgery

## 2023-11-03 ENCOUNTER — Ambulatory Visit (HOSPITAL_BASED_OUTPATIENT_CLINIC_OR_DEPARTMENT_OTHER)
Admission: RE | Admit: 2023-11-03 | Discharge: 2023-11-03 | Disposition: A | Discharge status: Home / Self Care | Attending: Orthopedic Surgery | Admitting: Orthopedic Surgery

## 2023-11-03 ENCOUNTER — Other Ambulatory Visit: Payer: Self-pay

## 2023-11-03 ENCOUNTER — Encounter (HOSPITAL_BASED_OUTPATIENT_CLINIC_OR_DEPARTMENT_OTHER)
Admission: RE | Disposition: A | Discharge status: Home / Self Care | Payer: Self-pay | Source: Home / Self Care | Attending: Orthopedic Surgery

## 2023-11-03 DIAGNOSIS — G5602 Carpal tunnel syndrome, left upper limb: Secondary | ICD-10-CM | POA: Insufficient documentation

## 2023-11-03 DIAGNOSIS — I10 Essential (primary) hypertension: Secondary | ICD-10-CM | POA: Diagnosis not present

## 2023-11-03 DIAGNOSIS — G4730 Sleep apnea, unspecified: Secondary | ICD-10-CM | POA: Diagnosis not present

## 2023-11-03 DIAGNOSIS — Z01818 Encounter for other preprocedural examination: Secondary | ICD-10-CM

## 2023-11-03 HISTORY — PX: CARPAL TUNNEL RELEASE: SHX101

## 2023-11-03 SURGERY — CARPAL TUNNEL RELEASE
Anesthesia: General | Site: Wrist | Laterality: Left

## 2023-11-03 MED ORDER — CEFAZOLIN SODIUM-DEXTROSE 3-4 GM/150ML-% IV SOLN
3.0000 g | INTRAVENOUS | Status: AC
  Administered 2023-11-03: 3 g via INTRAVENOUS

## 2023-11-03 MED ORDER — MIDAZOLAM HCL (PF) 2 MG/2ML IJ SOLN
INTRAMUSCULAR | Status: DC | PRN
  Administered 2023-11-03: 2 mg via INTRAVENOUS

## 2023-11-03 MED ORDER — 0.9 % SODIUM CHLORIDE (POUR BTL) OPTIME
TOPICAL | Status: DC | PRN
  Administered 2023-11-03: 200 mL

## 2023-11-03 MED ORDER — ACETAMINOPHEN 500 MG PO TABS
1000.0000 mg | ORAL_TABLET | Freq: Once | ORAL | Status: AC
  Administered 2023-11-03: 1000 mg via ORAL

## 2023-11-03 MED ORDER — FENTANYL CITRATE (PF) 100 MCG/2ML IJ SOLN
INTRAMUSCULAR | Status: AC
  Filled 2023-11-03: qty 2

## 2023-11-03 MED ORDER — LACTATED RINGERS IV SOLN: INTRAVENOUS | Status: DC

## 2023-11-03 MED ORDER — FENTANYL CITRATE (PF) 100 MCG/2ML IJ SOLN
INTRAMUSCULAR | Status: DC | PRN
  Administered 2023-11-03 (×2): 25 ug via INTRAVENOUS
  Administered 2023-11-03: 50 ug via INTRAVENOUS

## 2023-11-03 MED ORDER — ONDANSETRON HCL 4 MG/2ML IJ SOLN
INTRAMUSCULAR | Status: DC | PRN
  Administered 2023-11-03: 4 mg via INTRAVENOUS

## 2023-11-03 MED ORDER — PROPOFOL 10 MG/ML IV BOLUS
INTRAVENOUS | Status: DC | PRN
  Administered 2023-11-03: 200 mg via INTRAVENOUS

## 2023-11-03 MED ORDER — ACETAMINOPHEN 500 MG PO TABS: 500.0000 mg | ORAL_TABLET | Freq: Once | ORAL | Status: DC

## 2023-11-03 MED ORDER — BUPIVACAINE HCL (PF) 0.25 % IJ SOLN
INTRAMUSCULAR | Status: DC | PRN
  Administered 2023-11-03: 9 mL

## 2023-11-03 MED ORDER — HYDROCODONE-ACETAMINOPHEN 5-325 MG PO TABS: 1.0000 | ORAL_TABLET | Freq: Four times a day (QID) | ORAL | 0 refills | Status: AC | PRN

## 2023-11-03 MED ORDER — ACETAMINOPHEN 500 MG PO TABS
ORAL_TABLET | ORAL | Status: AC
  Filled 2023-11-03: qty 2

## 2023-11-03 MED ORDER — LIDOCAINE HCL (CARDIAC) PF 100 MG/5ML IV SOSY
PREFILLED_SYRINGE | INTRAVENOUS | Status: DC | PRN
  Administered 2023-11-03: 80 mg via INTRAVENOUS

## 2023-11-03 MED ORDER — DEXAMETHASONE SODIUM PHOSPHATE 4 MG/ML IJ SOLN
INTRAMUSCULAR | Status: DC | PRN
  Administered 2023-11-03: 5 mg via INTRAVENOUS

## 2023-11-03 MED ORDER — MIDAZOLAM HCL 2 MG/2ML IJ SOLN
INTRAMUSCULAR | Status: AC
  Filled 2023-11-03: qty 2

## 2023-11-03 MED ORDER — CEFAZOLIN SODIUM-DEXTROSE 3-4 GM/150ML-% IV SOLN
INTRAVENOUS | Status: AC
  Filled 2023-11-03: qty 150

## 2023-11-03 SURGICAL SUPPLY — 29 items
BLADE SURG 15 STRL LF DISP TIS (BLADE) ×2 IMPLANT
BNDG COMPR ESMARK 4X3 LF (GAUZE/BANDAGES/DRESSINGS) IMPLANT
BNDG ELASTIC 3INX 5YD STR LF (GAUZE/BANDAGES/DRESSINGS) ×1 IMPLANT
BNDG GAUZE DERMACEA FLUFF 4 (GAUZE/BANDAGES/DRESSINGS) ×1 IMPLANT
CHLORAPREP W/TINT 26 (MISCELLANEOUS) ×1 IMPLANT
CORD BIPOLAR FORCEPS 12FT (ELECTRODE) ×1 IMPLANT
COVER BACK TABLE 60X90IN (DRAPES) ×1 IMPLANT
COVER MAYO STAND STRL (DRAPES) ×1 IMPLANT
CUFF TOURN SGL QUICK 18X4 (TOURNIQUET CUFF) ×1 IMPLANT
DRAPE EXTREMITY T 121X128X90 (DISPOSABLE) ×1 IMPLANT
DRAPE SURG 17X23 STRL (DRAPES) ×1 IMPLANT
GAUZE PAD ABD 8X10 STRL (GAUZE/BANDAGES/DRESSINGS) ×1 IMPLANT
GAUZE SPONGE 4X4 12PLY STRL (GAUZE/BANDAGES/DRESSINGS) ×1 IMPLANT
GAUZE XEROFORM 1X8 LF (GAUZE/BANDAGES/DRESSINGS) ×1 IMPLANT
GLOVE BIO SURGEON STRL SZ7.5 (GLOVE) ×1 IMPLANT
GLOVE BIOGEL PI IND STRL 8 (GLOVE) ×1 IMPLANT
GOWN STRL REUS W/ TWL LRG LVL3 (GOWN DISPOSABLE) ×1 IMPLANT
GOWN STRL REUS W/TWL XL LVL3 (GOWN DISPOSABLE) ×1 IMPLANT
NDL HYPO 25X1 1.5 SAFETY (NEEDLE) ×1 IMPLANT
NEEDLE HYPO 25X1 1.5 SAFETY (NEEDLE) ×1 IMPLANT
NS IRRIG 1000ML POUR BTL (IV SOLUTION) ×1 IMPLANT
PACK BASIN DAY SURGERY FS (CUSTOM PROCEDURE TRAY) ×1 IMPLANT
PADDING CAST ABS COTTON 4X4 ST (CAST SUPPLIES) ×1 IMPLANT
STOCKINETTE 4X48 STRL (DRAPES) ×1 IMPLANT
SUT ETHILON 4 0 PS 2 18 (SUTURE) ×1 IMPLANT
SYR BULB EAR ULCER 3OZ GRN STR (SYRINGE) ×1 IMPLANT
SYR CONTROL 10ML LL (SYRINGE) ×1 IMPLANT
TOWEL GREEN STERILE FF (TOWEL DISPOSABLE) ×2 IMPLANT
UNDERPAD 30X36 HEAVY ABSORB (UNDERPADS AND DIAPERS) ×1 IMPLANT

## 2023-11-03 NOTE — Anesthesia Procedure Notes (Signed)
 Procedure Name: LMA Insertion Date/Time: 11/03/2023 3:16 PM  Performed by: Donnell Berwyn SQUIBB, CRNAPre-anesthesia Checklist: Patient identified, Emergency Drugs available, Suction available, Patient being monitored and Timeout performed Patient Re-evaluated:Patient Re-evaluated prior to induction Oxygen Delivery Method: Circle system utilized Preoxygenation: Pre-oxygenation with 100% oxygen Induction Type: IV induction Ventilation: Mask ventilation without difficulty LMA: LMA inserted LMA Size: 4.0 Number of attempts: 1 Placement Confirmation: positive ETCO2 and breath sounds checked- equal and bilateral Tube secured with: Tape Dental Injury: Teeth and Oropharynx as per pre-operative assessment

## 2023-11-03 NOTE — Discharge Instructions (Addendum)
 [facility] Instructions Hand Surgery  Wound Care: Keep your hand elevated above the level of your heart.  Do not allow it to dangle by your side.  Keep the dressing dry and do not remove it unless your doctor advises you to do so.  He will usually change it at the time of your post-op visit.  Moving your fingers is advised to stimulate circulation but will depend on the site of your surgery.  If you have a splint applied, your doctor will advise you regarding movement.  Activity: Do not drive or operate machinery today.  Rest today and then you may return to your normal activity and work as indicated by your physician.  Diet:  Drink liquids today or eat a light diet.  You may resume a regular diet tomorrow.    General expectations: Pain for two to three days. Fingers may become slightly swollen.  Call your doctor if any of the following occur: Severe pain not relieved by pain medication. Elevated temperature. Dressing soaked with blood. Inability to move fingers. White or bluish color to fingers.   No Tylenol  until after 8:00pm today if needed.   Post Anesthesia Home Care Instructions  Activity: Get plenty of rest for the remainder of the day. A responsible individual must stay with you for 24 hours following the procedure.  For the next 24 hours, DO NOT: -Drive a car -Advertising copywriter -Drink alcoholic beverages -Take any medication unless instructed by your physician -Make any legal decisions or sign important papers.  Meals: Start with liquid foods such as gelatin or soup. Progress to regular foods as tolerated. Avoid greasy, spicy, heavy foods. If nausea and/or vomiting occur, drink only clear liquids until the nausea and/or vomiting subsides. Call your physician if vomiting continues.  Special Instructions/Symptoms: Your throat may feel dry or sore from the anesthesia or the breathing tube placed in your throat during surgery. If this causes discomfort, gargle with warm  salt water. The discomfort should disappear within 24 hours.  If you had a scopolamine patch placed behind your ear for the management of post- operative nausea and/or vomiting:  1. The medication in the patch is effective for 72 hours, after which it should be removed.  Wrap patch in a tissue and discard in the trash. Wash hands thoroughly with soap and water. 2. You may remove the patch earlier than 72 hours if you experience unpleasant side effects which may include dry mouth, dizziness or visual disturbances. 3. Avoid touching the patch. Wash your hands with soap and water after contact with the patch.

## 2023-11-03 NOTE — H&P (Signed)
 Kristine Hayes is an 53 y.o. female.   Chief Complaint: carpal tunnel syndrome HPI: 53 y.o. yo female with numbness and tingling bilateral hand.  Nocturnal symptoms. Positive nerve conduction studies. She wishes to have left carpal tunnel release.   Allergies:  Allergies  Allergen Reactions   Celebrex [Celecoxib] Anaphylaxis and Other (See Comments)    REACTION: throat tightness, pt says she can take Ibuprofen  without any problems    Past Medical History:  Diagnosis Date   Elevated hemoglobin A1c 05/02/2023   level 5.8   Hypertension    BORDERLINE   Lupus    DX-SKIN BIOPSY-10   Shortness of breath    on exertion due to wt gain per pt   Sleep apnea    mild-no CPAP    Past Surgical History:  Procedure Laterality Date   ABDOMINAL HYSTERECTOMY N/A 07/09/2012   Procedure: HYSTERECTOMY ABDOMINAL;  Surgeon: Evalene SHAUNNA Organ, MD;  Location: [facility];  Service: Gynecology;  Laterality: N/A;   ABDOMINAL SURGERY  07/2002   ABDOMINAL MYOMECTOMY   BIOPSY  12/29/2020   Procedure: BIOPSY;  Surgeon: Saintclair Jasper, MD;  Location: [facility];  Service: Gastroenterology;;   COLONOSCOPY WITH PROPOFOL  N/A 12/29/2020   Procedure: COLONOSCOPY WITH PROPOFOL ;  Surgeon: Saintclair Jasper, MD;  Location: [facility];  Service: Gastroenterology;  Laterality: N/A;   ESOPHAGOGASTRODUODENOSCOPY (EGD) WITH PROPOFOL  N/A 12/28/2015   Procedure: ESOPHAGOGASTRODUODENOSCOPY (EGD) WITH PROPOFOL ;  Surgeon: Gladis MARLA Louder, MD;  Location: [facility];  Service: Endoscopy;  Laterality: N/A;   FLEXIBLE SIGMOIDOSCOPY N/A 12/28/2015   Procedure: FLEXIBLE SIGMOIDOSCOPY;  Surgeon: Gladis MARLA Louder, MD;  Location: [facility];  Service: Endoscopy;  Laterality: N/A;   POLYPECTOMY  12/29/2020   Procedure: POLYPECTOMY;  Surgeon: Saintclair Jasper, MD;  Location: [facility];  Service: Gastroenterology;;    Family History: Family History  Problem Relation Age of Onset   Breast cancer Maternal Aunt        Age unknown     Social History:   reports that she has never smoked. She has never used smokeless tobacco. She reports that she does not drink alcohol and does not use drugs.  Medications: Medications Prior to Admission  Medication Sig Dispense Refill   Ascorbic Acid (VITAMIN C) 500 MG CHEW Chew 500 mg by mouth daily.     calcium carbonate (OS-CAL - DOSED IN MG OF ELEMENTAL CALCIUM) 1250 (500 Ca) MG tablet Take 1 tablet by mouth daily with breakfast.     Cholecalciferol (VITAMIN D3) 5000 units CAPS Take 5,000 Units by mouth daily.     Garlic 1000 MG CAPS Take 1,000 mg by mouth daily.     hydroxychloroquine (PLAQUENIL) 200 MG tablet Take 200 mg by mouth daily.      losartan-hydrochlorothiazide (HYZAAR) 100-12.5 MG tablet Take 1 tablet by mouth daily.     mesalamine (APRISO) 0.375 g 24 hr capsule 1.5 g daily.     Multiple Vitamin (MULTIVITAMIN) tablet Take 1 tablet by mouth daily.     OMEGA-3 FATTY ACIDS PO Take 1 capsule by mouth daily.     polyethylene glycol-electrolytes (NULYTELY) 420 g solution Take by mouth.     Wheat Dextrin (BENEFIBER) POWD Take 1 packet by mouth daily as needed (constipation).     clotrimazole -betamethasone  (LOTRISONE ) cream Apply 1 Application topically 2 (two) times daily. Use for 2 weeks at a time as needed. 30 g 0   Cyanocobalamin  (VITAMIN B-12) 2500 MCG SUBL Place 2,500 mcg under the tongue daily.  nystatin  (MYCOSTATIN /NYSTOP ) powder Apply 1 application topically 3 (three) times daily. Apply to affected area for up to 7 days 15 g 5   valACYclovir (VALTREX) 1000 MG tablet Take 1,000 mg by mouth 2 (two) times daily as needed (cold sores).      No results found for this or any previous visit (from the past 48 hours).  No results found.    Height 5' 5.5 (1.664 m), weight (!) 143.7 kg, last menstrual period 04/20/2012.  General appearance: alert, cooperative, and appears stated age Head: Normocephalic, without obvious abnormality, atraumatic Neck: supple,  symmetrical, trachea midline Extremities: Intact sensation and capillary refill all digits.  +epl/fpl/io.  No wounds.  Skin: Skin color, texture, turgor normal. No rashes or lesions Neurologic: Grossly normal Incision/Wound: none  Assessment/Plan Left carpal tunnel syndrome.  Non operative and operative treatment options have been discussed with the patient and patient wishes to proceed with operative treatment.  Risks, benefits and alternatives of surgery were discussed including risks of blood loss, infection, damage to nerves/vessels/tendons/ligament/bone, failure of surgery, need for additional surgery, complication with wound healing, stiffness, damage to motor branch, recurrence.  She voiced understanding of these risks and elected to proceed.    Perry Molla 11/03/2023, 1:37 PM

## 2023-11-03 NOTE — Op Note (Signed)
 11/03/2023 [facility]                              OPERATIVE REPORT   PREOPERATIVE DIAGNOSIS:  Left carpal tunnel syndrome  POSTOPERATIVE DIAGNOSIS:  Left carpal tunnel syndrome  PROCEDURE:  Left carpal tunnel release  SURGEON:  Franky Curia, MD  ASSISTANT:  none.  ANESTHESIA: General  IV FLUIDS:  Per anesthesia flow sheet  ESTIMATED BLOOD LOSS:  Minimal  COMPLICATIONS:  None  SPECIMENS:  None  TOURNIQUET TIME:    Total Tourniquet Time Documented: Forearm (Left) - 11 minutes Total: Forearm (Left) - 11 minutes   DISPOSITION:  Stable to PACU  LOCATION: [facility]  INDICATIONS:  53 y.o. yo female with numbness and tingling left hand.  Nocturnal symptoms. Positive nerve conduction studies. She wishes to proceed with left carpal tunnel release.  Risks, benefits and alternatives of surgery were discussed including the risk of blood loss; infection; damage to nerves, vessels, tendons, ligaments, bone; failure of surgery; need for additional surgery; complications with wound healing; continued pain; recurrence of carpal tunnel syndrome; and damage to motor branch. She voiced understanding of these risks and elected to proceed.   OPERATIVE COURSE:  After being identified preoperatively by myself, the patient and I agreed upon the procedure and site of procedure.  The surgical site was marked.  Surgical consent had been signed.  She was given IV Ancef as preoperative antibiotic prophylaxis.  She was transferred to the operating room and placed on the operating room table in supine position with the Left upper extremity on an armboard.  General anesthesia was induced by the anesthesiologist.  Left upper extremity was prepped and draped in normal sterile orthopaedic fashion.  A surgical pause was performed between the surgeons, anesthesia, and operating room staff, and all were in agreement as to the patient, procedure, and site of procedure.  Tourniquet at the  proximal aspect of the forearm was inflated to 250 mmHg after exsanguination of the arm with an Esmarch bandage  Incision was made over the transverse carpal ligament and carried into the subcutaneous tissues by spreading technique.  Bipolar electrocautery was used to obtain hemostasis.  The palmar fascia was sharply incised.  The transverse carpal ligament was identified.  The fascia distal to the ligament was opened.  Retractor was placed and the flexor tendons were identified.  The flexor tendon to the ring finger was identified and retracted radially.  The transverse carpal ligament was then incised from distal to proximal under direct visualization.  Scissors were used to split the distal aspect of the volar antebrachial fascia.  A finger was placed into the wound to ensure complete decompression, which was the case.  The nerve was examined.  It was adherent to the radial leaflet.  The motor branch was identified and was intact.  The wound was copiously irrigated with sterile saline.  It was then closed with 4-0 nylon in a horizontal mattress fashion.  It was injected with 0.25% plain Marcaine  to aid in postoperative analgesia.  It was dressed with sterile Xeroform, 4x4s, an ABD, and wrapped with Kerlix and an Ace bandage.  Tourniquet was deflated at 11 minutes.  Fingertips were pink with brisk capillary refill after deflation of the tourniquet.  Operative drapes were broken down.  The patient was awoken from anesthesia safely.  She was transferred back to stretcher and taken to the PACU in stable condition.  I will see her back in the office in 1 week for postoperative followup.  I will give her a prescription for Norco 5/325 1 tab PO q6 hours prn pain, dispense #15.    Noma Quijas, MD Electronically signed, 11/03/23

## 2023-11-03 NOTE — Anesthesia Postprocedure Evaluation (Signed)
 Anesthesia Post Note  Patient: Kristine Hayes  Procedure(s) Performed: CARPAL TUNNEL RELEASE (Left: Wrist)     Patient location during evaluation: PACU Anesthesia Type: General Level of consciousness: awake and alert Pain management: pain level controlled Vital Signs Assessment: post-procedure vital signs reviewed and stable Respiratory status: spontaneous breathing, nonlabored ventilation, respiratory function stable and patient connected to nasal cannula oxygen Cardiovascular status: blood pressure returned to baseline and stable Postop Assessment: no apparent nausea or vomiting Anesthetic complications: no   No notable events documented.  Last Vitals:  Vitals:   11/03/23 1554 11/03/23 1615  BP: (!) 144/90 (!) 153/98  Pulse: 75 81  Resp: (!) 22 16  Temp:  36.6 C  SpO2: 100% 98%    Last Pain:  Vitals:   11/03/23 1615  PainSc: 0-No pain                 Cordella P Milanni Ayub

## 2023-11-03 NOTE — Transfer of Care (Signed)
 Immediate Anesthesia Transfer of Care Note  Patient: Kristine Hayes  Procedure(s) Performed: CARPAL TUNNEL RELEASE (Left: Wrist)  Patient Location: PACU  Anesthesia Type:General  Level of Consciousness: awake and patient cooperative  Airway & Oxygen Therapy: Patient Spontanous Breathing and Patient connected to face mask oxygen  Post-op Assessment: Report given to RN and Post -op Vital signs reviewed and stable  Post vital signs: Reviewed and stable  Last Vitals:  Vitals Value Taken Time  BP 134/84 11/03/23 15:42  Temp    Pulse 78 11/03/23 15:42  Resp 13 11/03/23 15:42  SpO2 100 % 11/03/23 15:42  Vitals shown include unfiled device data.  Last Pain:  Vitals:   11/03/23 1340  PainSc: 0-No pain         Complications: No notable events documented.

## 2023-11-03 NOTE — Anesthesia Preprocedure Evaluation (Addendum)
 Anesthesia Evaluation  Patient identified by MRN, date of birth, ID band Patient awake    Reviewed: Allergy & Precautions, NPO status , Patient's Chart, lab work & pertinent test results  Airway Mallampati: II  TM Distance: >3 FB Neck ROM: Full    Dental no notable dental hx.    Pulmonary sleep apnea    Pulmonary exam normal        Cardiovascular hypertension,  Rhythm:Regular Rate:Normal     Neuro/Psych negative neurological ROS  negative psych ROS   GI/Hepatic Neg liver ROS, PUD,,,  Endo/Other  negative endocrine ROS    Renal/GU negative Renal ROS  negative genitourinary   Musculoskeletal CTS   Abdominal Normal abdominal exam  (+)   Peds  Hematology Lab Results      Component                Value               Date                      WBC                      17.4 (H)            09/22/2015                HGB                      13.8                09/22/2015                HCT                      39.0                09/22/2015                MCV                      85.3                09/22/2015                PLT                      181                 09/22/2015              Anesthesia Other Findings   Reproductive/Obstetrics                              Anesthesia Physical Anesthesia Plan  ASA: 2  Anesthesia Plan: General   Post-op Pain Management: Tylenol  PO (pre-op)*   Induction: Intravenous  PONV Risk Score and Plan: 3 and Ondansetron , Dexamethasone , Midazolam  and Treatment may vary due to age or medical condition  Airway Management Planned: Mask and LMA  Additional Equipment: None  Intra-op Plan:   Post-operative Plan: Extubation in OR  Informed Consent: I have reviewed the patients History and Physical, chart, labs and discussed the procedure including the risks, benefits and alternatives for the proposed anesthesia with the patient or authorized representative  who has indicated his/her understanding and acceptance.     Dental  advisory given  Plan Discussed with: CRNA  Anesthesia Plan Comments:         Anesthesia Quick Evaluation

## 2023-11-04 ENCOUNTER — Encounter (HOSPITAL_BASED_OUTPATIENT_CLINIC_OR_DEPARTMENT_OTHER): Payer: Self-pay | Admitting: Orthopedic Surgery

## 2023-11-10 DIAGNOSIS — G5603 Carpal tunnel syndrome, bilateral upper limbs: Secondary | ICD-10-CM | POA: Diagnosis not present

## 2023-11-14 DIAGNOSIS — K5190 Ulcerative colitis, unspecified, without complications: Secondary | ICD-10-CM | POA: Diagnosis not present

## 2023-11-14 DIAGNOSIS — Z79899 Other long term (current) drug therapy: Secondary | ICD-10-CM | POA: Diagnosis not present

## 2023-11-14 DIAGNOSIS — M329 Systemic lupus erythematosus, unspecified: Secondary | ICD-10-CM | POA: Diagnosis not present

## 2023-11-14 DIAGNOSIS — M1990 Unspecified osteoarthritis, unspecified site: Secondary | ICD-10-CM | POA: Diagnosis not present

## 2023-11-17 DIAGNOSIS — G5603 Carpal tunnel syndrome, bilateral upper limbs: Secondary | ICD-10-CM | POA: Diagnosis not present

## 2023-11-19 NOTE — H&P (Signed)
 History of Present Illness   53/female with history of ulcerative colitis(on mesalamine 0.375 gm 4 pills a day) diagnosed 16 years ago, last seen in 11/2021.  Colonoscopy 12/29/2020 for surveillance showed mild chronic colitis in cecum, ascending, flexure, transverse, sigmoid, and hyperplastic polyp was removed, repeat recommended in 2 years.  Her Bms are ok, as long as she takes a good amount of water. Occasionally stools are hard, depending on what she has eaten a day before. She takes 1 packer of benefiber a day. She has not noted blood in stool since May or April of last year. Denies abdominal pain or rectal pain. She has excessive gas depending on how much fiber she consumed the day before. She occasionally has anal itching. She has mild nausea in the mornings, denies acid reflux or heartburn, denies difficulty or pain on swallowing. Denies unintentional weight loss or loss of appetite. She has been walking more, decreasing calorie intake, she is being seen at a wellness center.  Current Medications Benefiber(Wheat Dextrin) - Powder 1 Scoop in 8oz Orally once a day Calcium 600 MG Tablet 1 tablet with meals Orally once a day Garlic 1000 MG Capsule 1 capsule Orally once a day Losartan Potassium-HCTZ 100-12.5 MG Tablet 1 tablet Orally Once a day Mesalamine ER 0.375 GM Capsule Extended Release 24 Hour TAKE 4 CAPSULES BY MOUTH EVERY MORNING orally once a day Omega-3 Fish Oil 1170 mg Capsule 1 tablet Orally once a day Plaquenil 200 MG Tablet 1 tablet Orally once a day Tylenol  325 MG Tablet 2 tabs Orally every 6 hrs As needed, Notes to Pharmacist: PRN Valtrex(valACYclovir HCl) 1 GM Tablet 1 tablet Orally every 12 hrs. x 2 doses As needed, Notes to Pharmacist: PRN Vitamin B12 3000 MCG Tablet Sublingual 1 tablet Sublingual Once a day Vitamin C 500 MG Tablet Chewable 1 tablet Orally Once a day Vitamin D3 125 MCG (5000 UT) Capsule 1 capsule Orally Once a day Multivitamin Gummies  Adult(Multiple Vitamins-Minerals) - Tablet Chewable Take 2 tab Orally once a day Medication List reviewed and reconciled with the patient  Past Medical History Hypertension. headache ( PMS). HSV. Vit D Deficiency, normal 11/2008. Systemic lupus: joint swelling and pain, erythema nodosum, discoid rash on scalp, +ANA 1:320, RNP+, dsDNA+ > Dr. Leni > [facility]. Chronic active UC, 12/2009, Dr. Gladis Louder, Ulcerative Pancolitis 12/2020, Dr. Saintclair. GYN: Dr. Rockney. anemia, iron deficiency due to menometrorrhagia, fibroids, s/p TAH 06/2012, Dr. Rockney. fibroids s/p TAH. blood type A+. Chiropractor. plantar fasciitis/heel spurs, Podiatry. Fatigue (postponed sleep study 01/2017). Vein Specialist, Dr. Audrey. Ulcerative Pancolitis 12/2020, Dr. Saintclair. 02/2008 PSG AHI 5.64, REM AHI 24.47, weight 250lbs.. 06/02/22 HST est AHI 28.2, nadir 87%, O2 < 90% for 55m, weight 313lb. ESS 5 on 04/12/22.  Surgical History myomectomy fibroid 07/04 Hysterectomy 07/09/2012 colonoscopy 01/17/2019;12/2020  Family History Father: deceased 62 yrs, Died from accident; (struck by [employer]) Mother: deceased 80 yrs, elevated cholesterol, sudden death 01/17/22 Brother 1: alive, unsure of medical hx Sister 1: alive, unsure of medical hx, diagnosed with Diabetes Sister 2: alive, unsure of medical hx Sister 3: alive, unsure of medical hx Paternal Grand Mother: diagnosed with Diabetes Paternal uncle: diagnosed with Diabetes Paternal aunt: diagnosed with Breast cancer Sister 4: unknown 1 brother(s) , 4 sister(s) . no autoimmune disease No Family History of Colon Cancer, Polyps, kidney or Liver Disease.  Social History    General:  Tobacco use  cigarettes: Never smoked Tobacco history last updated 03/21/2023 Vaping No EXPOSURE TO PASSIVE SMOKE: no, never. Alcohol:  no. Caffeine: yes, unsweetened /Half tea 1-2 cups occasionally. Recreational drug use: no. DIET: good, [facility]. Exercise: yes, Walking  more. Marital Status: Divorced. Children: none. OCCUPATION: employed, Animal Nutritionist. Seat belt use: yes. Smoking: no. Gyn History Sexual activity not currently sexually active.  Periods : HYST.  LMP Hysterectomy.  Last pap smear date HYST > Dr. Rockney 10/2015.  Last mammogram date 12/2020--neg.  Abnormal pap smear none.  OB History miscarriages  1.  Number of pregnancies  2,.  Pregnancy # 1  unknown.  Pregnancy # 2  unknown.   Allergies Celebrex: throat tightness - Side Effects Hospitalization/Major Diagnostic Procedure none in the past year 03/2023  Vital Signs Wt: 317.8, Wt change: 3.8 lbs, Ht: 65, BMI: 52.88, Temp: 98.4, Pulse sitting: 86, BP sitting: 140/88. manual b/p on LFA. Examination GENERAL APPEARANCE:  Well developed, morbid obesity, no active distress, pleasant.  SCLERA:  anicteric.  CARDIOVASCULAR  Normal RRR .  RESPIRATORY  Breath sounds normal. Respiration even and unlabored.  ABDOMEN  No masses palpated. Liver and spleen not palpated, normal. Bowel sounds normal, Abdomen not distended.  EXTREMITIES:  No edema.  NEURO:  alert, oriented to time, place and person, normal gait.  PSYCH:  mood/affect normal.  Assessments 1. History of ulcerative colitis - Z87.19 (Primary)    2. Morbidly obese - E66.01    Treatment 1. History of ulcerative colitis    Colonoscopy  Appears to be in clinical remission. She was due for surveillance colonoscopy in 12/24. Will proceed with scheduling for surveillance colonoscopy at [facility] due to BMI being more than 50. The risks and the benefits of the procedure were discussed with the patient in details. The patient understands and verbalizes consent. The patient will be given written instructions, prescription for preparation will be scheduled for the same. Continue mesalamine 0.375 g 4 pills a day.    2. Morbidly obese  Patient is working on calorie restriction, increase movement, strength training, using CPAP at  home and increasing protein in her diet, also being seen at wellness center for weight loss.

## 2023-11-20 ENCOUNTER — Encounter (HOSPITAL_COMMUNITY)
Admission: RE | Disposition: A | Discharge status: Home / Self Care | Payer: Self-pay | Source: Home / Self Care | Attending: Gastroenterology

## 2023-11-20 ENCOUNTER — Ambulatory Visit (HOSPITAL_COMMUNITY): Admitting: Anesthesiology

## 2023-11-20 ENCOUNTER — Encounter (HOSPITAL_COMMUNITY): Payer: Self-pay | Admitting: Gastroenterology

## 2023-11-20 ENCOUNTER — Ambulatory Visit (HOSPITAL_COMMUNITY)
Admission: RE | Admit: 2023-11-20 | Discharge: 2023-11-20 | Disposition: A | Discharge status: Home / Self Care | Attending: Gastroenterology | Admitting: Gastroenterology

## 2023-11-20 ENCOUNTER — Other Ambulatory Visit: Payer: Self-pay

## 2023-11-20 DIAGNOSIS — K5190 Ulcerative colitis, unspecified, without complications: Secondary | ICD-10-CM | POA: Diagnosis not present

## 2023-11-20 DIAGNOSIS — I10 Essential (primary) hypertension: Secondary | ICD-10-CM | POA: Diagnosis not present

## 2023-11-20 DIAGNOSIS — G4730 Sleep apnea, unspecified: Secondary | ICD-10-CM | POA: Diagnosis not present

## 2023-11-20 DIAGNOSIS — K648 Other hemorrhoids: Secondary | ICD-10-CM | POA: Diagnosis not present

## 2023-11-20 DIAGNOSIS — K5100 Ulcerative (chronic) pancolitis without complications: Secondary | ICD-10-CM | POA: Diagnosis not present

## 2023-11-20 DIAGNOSIS — Z6841 Body Mass Index (BMI) 40.0 and over, adult: Secondary | ICD-10-CM | POA: Diagnosis not present

## 2023-11-20 DIAGNOSIS — Z8711 Personal history of peptic ulcer disease: Secondary | ICD-10-CM | POA: Insufficient documentation

## 2023-11-20 DIAGNOSIS — Z1211 Encounter for screening for malignant neoplasm of colon: Secondary | ICD-10-CM | POA: Insufficient documentation

## 2023-11-20 HISTORY — PX: COLONOSCOPY: SHX5424

## 2023-11-20 SURGERY — COLONOSCOPY
Anesthesia: Monitor Anesthesia Care

## 2023-11-20 MED ORDER — PROPOFOL 10 MG/ML IV BOLUS
INTRAVENOUS | Status: DC | PRN
  Administered 2023-11-20: 100 ug/kg/min via INTRAVENOUS
  Administered 2023-11-20: 80 mg via INTRAVENOUS

## 2023-11-20 MED ORDER — SODIUM CHLORIDE 0.9 % IV SOLN
INTRAVENOUS | Status: DC
Start: 2023-11-20 — End: 2023-11-20

## 2023-11-20 MED ORDER — LIDOCAINE 2% (20 MG/ML) 5 ML SYRINGE
INTRAMUSCULAR | Status: DC | PRN
  Administered 2023-11-20: 60 mg via INTRAVENOUS

## 2023-11-20 MED ORDER — SODIUM CHLORIDE 0.9 % IV SOLN
INTRAVENOUS | Status: AC | PRN
  Administered 2023-11-20: 500 mL via INTRAVENOUS

## 2023-11-20 NOTE — Op Note (Signed)
 [facility] Patient Name: Kristine Hayes Procedure Date : 11/20/2023 MRN: [id number] Attending MD: [name] , MD, [id number] Date of Birth: August 24, 1970 CSN: [id number] Age: 53 Admit Type: Inpatient Procedure:                Colonoscopy Indications:              High risk colon cancer surveillance: Ulcerative                            colitis of 8 (or more) years duration, Last                            colonoscopy: 2022 Providers:                [name], MD, Willy Hummer, RN, Felice Sar,                            Technician Referring MD:             Barabara Dama, DO Medicines:                Monitored Anesthesia Care Complications:            No immediate complications. Estimated Blood Loss:     Estimated blood loss: none. Procedure:                Pre-Anesthesia Assessment:                           - Prior to the procedure, a History and Physical                            was performed, and patient medications and                            allergies were reviewed. The patient's tolerance of                            previous anesthesia was also reviewed. The risks                            and benefits of the procedure and the sedation                            options and risks were discussed with the patient.                            All questions were answered, and informed consent                            was obtained. Prior Anticoagulants: The patient has                            taken no anticoagulant or antiplatelet agents. ASA  Grade Assessment: III - A patient with severe                            systemic disease. After reviewing the risks and                            benefits, the patient was deemed in satisfactory                            condition to undergo the procedure.                           After obtaining informed consent, the colonoscope                            was passed under direct  vision. Throughout the                            procedure, the patient's blood pressure, pulse, and                            oxygen saturations were monitored continuously. The                            CF-HQ190L ([id number]) Olympus colonoscopy was                            introduced through the anus and advanced to the the                            terminal ileum. The colonoscopy was performed                            without difficulty. The patient tolerated the                            procedure well. The quality of the bowel                            preparation was excellent. The terminal ileum,                            ileocecal valve, appendiceal orifice, and rectum                            were photographed. Scope In: 7:46:42 AM Scope Out: 7:59:32 AM Scope Withdrawal Time: 0 hours 10 minutes 6 seconds  Total Procedure Duration: 0 hours 12 minutes 50 seconds  Findings:      The perianal and digital rectal examinations were normal.      The terminal ileum appeared normal.      Normal mucosa was found in the right colon. Biopsies were obtained in a       targeted manner for ulcerative colitis surveillance.      Normal mucosa was found in the transverse colon.  Biopsies were obtained       in a targeted manner for ulcerative colitis surveillance.      Normal mucosa was found in the left colon. Biopsies were obtained in a       targeted manner for ulcerative colitis surveillance.      Non-bleeding internal hemorrhoids were found during retroflexion. Impression:               - The examined portion of the ileum was normal.                           - Normal mucosa in the right colon. Biopsied.                           - Normal mucosa in the transverse colon. Biopsied.                           - Normal mucosa in the left colon. Biopsied.                           - Non-bleeding internal hemorrhoids. Moderate Sedation:      Patient did not receive moderate sedation for  this procedure, but       instead received monitored anesthesia care. Recommendation:           - Patient has a contact number available for                            emergencies. The signs and symptoms of potential                            delayed complications were discussed with the                            patient. Return to normal activities tomorrow.                            Written discharge instructions were provided to the                            patient.                           - Resume regular diet.                           - Continue present medications.                           - Await pathology results.                           - Repeat colonoscopy in 2 years for surveillance. Procedure Code(s):        --- Professional ---                           [phone], Colonoscopy, flexible; diagnostic, including  collection of specimen(s) by brushing or washing,                            when performed (separate procedure) Diagnosis Code(s):        --- Professional ---                           K51.00, Ulcerative (chronic) pancolitis without                            complications                           K64.8, Other hemorrhoids CPT copyright 2022 American Medical Association. All rights reserved. The codes documented in this report are preliminary and upon coder review may  be revised to meet current compliance requirements. [name], MD 11/20/2023 8:12:37 AM This report has been signed electronically. Number of Addenda: 0

## 2023-11-20 NOTE — Discharge Instructions (Signed)

## 2023-11-20 NOTE — Anesthesia Postprocedure Evaluation (Signed)
 Anesthesia Post Note  Patient: Kristine Hayes  Procedure(s) Performed: COLONOSCOPY     Patient location during evaluation: PACU Anesthesia Type: MAC Level of consciousness: awake and alert Pain management: pain level controlled Vital Signs Assessment: post-procedure vital signs reviewed and stable Respiratory status: spontaneous breathing, nonlabored ventilation and respiratory function stable Cardiovascular status: blood pressure returned to baseline and stable Postop Assessment: no apparent nausea or vomiting Anesthetic complications: no   No notable events documented.  Last Vitals:  Vitals:   11/20/23 0810 11/20/23 0820  BP: 116/87 124/87  Pulse: 89 77  Resp: (!) 25 18  Temp:    SpO2: 96% 100%    Last Pain:  Vitals:   11/20/23 0820  TempSrc:   PainSc: 0-No pain                 Butler Levander Pinal

## 2023-11-20 NOTE — Transfer of Care (Signed)
 Immediate Anesthesia Transfer of Care Note  Patient: Kristine Hayes  Procedure(s) Performed: COLONOSCOPY  Patient Location: PACU and Endoscopy Unit  Anesthesia Type:MAC  Level of Consciousness: awake and alert   Airway & Oxygen Therapy: Patient Spontanous Breathing and Patient connected to face mask oxygen  Post-op Assessment: Report given to RN and Post -op Vital signs reviewed and stable  Post vital signs: Reviewed and stable  Last Vitals:  Vitals Value Taken Time  BP 107/71 11/20/23 08:06  Temp 36.1 C 11/20/23 08:06  Pulse 92 11/20/23 08:09  Resp 31 11/20/23 08:09  SpO2 96 % 11/20/23 08:09  Vitals shown include unfiled device data.  Last Pain:  Vitals:   11/20/23 0806  TempSrc: Temporal  PainSc: 0-No pain         Complications: No notable events documented.

## 2023-11-20 NOTE — Interval H&P Note (Signed)
 History and Physical Interval Note: 53/female with UC for surveilllance colonoscopy. 11/20/2023 7:35 AM  Kristine Hayes  has presented today for surveilllance colonoscopy, with the diagnosis of Z87.19 Ulcerative Colitis.  The various methods of treatment have been discussed with the patient and family. After consideration of risks, benefits and other options for treatment, the patient has consented to  Procedure(s): COLONOSCOPY (N/A) as a surgical intervention.  The patient's history has been reviewed, patient examined, no change in status, stable for surgery.  I have reviewed the patient's chart and labs.  Questions were answered to the patient's satisfaction.     [name]

## 2023-11-20 NOTE — Anesthesia Preprocedure Evaluation (Addendum)
 Anesthesia Evaluation  Patient identified by MRN, date of birth, ID band Patient awake    Reviewed: Allergy & Precautions, NPO status , Patient's Chart, lab work & pertinent test results  Airway Mallampati: II  TM Distance: >3 FB Neck ROM: Full    Dental no notable dental hx.    Pulmonary sleep apnea    Pulmonary exam normal        Cardiovascular hypertension,  Rhythm:Regular Rate:Normal     Neuro/Psych negative neurological ROS  negative psych ROS   GI/Hepatic Neg liver ROS, PUD,,,  Endo/Other    Class 4 obesity  Renal/GU negative Renal ROS  negative genitourinary   Musculoskeletal CTS   Abdominal  (+) + obese  Peds  Hematology Lab Results      Component                Value               Date                      WBC                      17.4 (H)            09/22/2015                HGB                      13.8                09/22/2015                HCT                      39.0                09/22/2015                MCV                      85.3                09/22/2015                PLT                      181                 09/22/2015              Anesthesia Other Findings   Reproductive/Obstetrics                              Anesthesia Physical Anesthesia Plan  ASA: 3  Anesthesia Plan: MAC   Post-op Pain Management: Minimal or no pain anticipated   Induction: Intravenous  PONV Risk Score and Plan: 2 and Treatment may vary due to age or medical condition and Propofol  infusion  Airway Management Planned: Simple Face Mask  Additional Equipment: None  Intra-op Plan:   Post-operative Plan:   Informed Consent: I have reviewed the patients History and Physical, chart, labs and discussed the procedure including the risks, benefits and alternatives for the proposed anesthesia with the patient or authorized representative who has indicated his/her understanding and  acceptance.     Dental advisory given  Plan Discussed with: CRNA  Anesthesia Plan Comments:          Anesthesia Quick Evaluation

## 2023-11-21 ENCOUNTER — Encounter (HOSPITAL_COMMUNITY): Payer: Self-pay | Admitting: Gastroenterology

## 2023-11-21 LAB — SURGICAL PATHOLOGY

## 2023-11-22 ENCOUNTER — Other Ambulatory Visit: Payer: Self-pay | Admitting: Orthopedic Surgery

## 2023-11-22 DIAGNOSIS — R7303 Prediabetes: Secondary | ICD-10-CM | POA: Diagnosis not present

## 2023-11-22 DIAGNOSIS — E559 Vitamin D deficiency, unspecified: Secondary | ICD-10-CM | POA: Diagnosis not present

## 2023-11-22 DIAGNOSIS — I10 Essential (primary) hypertension: Secondary | ICD-10-CM | POA: Diagnosis not present

## 2023-11-22 DIAGNOSIS — G4733 Obstructive sleep apnea (adult) (pediatric): Secondary | ICD-10-CM | POA: Diagnosis not present

## 2024-01-23 ENCOUNTER — Ambulatory Visit (HOSPITAL_COMMUNITY): Payer: Self-pay

## 2024-03-22 ENCOUNTER — Encounter (HOSPITAL_BASED_OUTPATIENT_CLINIC_OR_DEPARTMENT_OTHER): Payer: Self-pay

## 2024-03-22 ENCOUNTER — Ambulatory Visit (HOSPITAL_BASED_OUTPATIENT_CLINIC_OR_DEPARTMENT_OTHER): Admit: 2024-03-22 | Admitting: Orthopedic Surgery

## 2024-03-22 SURGERY — CARPAL TUNNEL RELEASE
Anesthesia: Choice | Site: Wrist | Laterality: Right

## 2024-04-22 ENCOUNTER — Ambulatory Visit: Admitting: Obstetrics and Gynecology
